# Patient Record
Sex: Female | Born: 1975 | Race: White | Hispanic: No | Marital: Married | Smoking: Never smoker
Health system: Southern US, Community
[De-identification: ages and names within clinical notes are randomized; demographics above are authoritative.]

## PROBLEM LIST (undated history)

## (undated) DIAGNOSIS — IMO0002 Reserved for concepts with insufficient information to code with codable children: Secondary | ICD-10-CM

## (undated) DIAGNOSIS — I639 Cerebral infarction, unspecified: Secondary | ICD-10-CM

## (undated) DIAGNOSIS — N8003 Adenomyosis of the uterus: Secondary | ICD-10-CM

## (undated) DIAGNOSIS — C801 Malignant (primary) neoplasm, unspecified: Secondary | ICD-10-CM

## (undated) DIAGNOSIS — E039 Hypothyroidism, unspecified: Secondary | ICD-10-CM

## (undated) DIAGNOSIS — M329 Systemic lupus erythematosus, unspecified: Secondary | ICD-10-CM

## (undated) DIAGNOSIS — N809 Endometriosis, unspecified: Secondary | ICD-10-CM

## (undated) HISTORY — DX: Adenomyosis of the uterus: N80.03

## (undated) HISTORY — PX: CHOLECYSTECTOMY: SHX55

## (undated) HISTORY — PX: ABDOMINAL HYSTERECTOMY: SHX81

## (undated) HISTORY — DX: Endometriosis, unspecified: N80.9

## (undated) HISTORY — PX: MASTECTOMY: SHX3

## (undated) HISTORY — PX: APPENDECTOMY: SHX54

## (undated) HISTORY — PX: BREAST RECONSTRUCTION: SHX9

---
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
## 1997-11-16 HISTORY — PX: ABDOMINAL HYSTERECTOMY: SHX81

## 2018-07-30 HISTORY — PX: MASTECTOMY: SHX3

## 2018-09-18 HISTORY — PX: LAPAROSCOPIC SALPINGOOPHERECTOMY: SUR795

## 2019-11-17 HISTORY — PX: OTHER SURGICAL HISTORY: SHX169

## 2021-02-16 HISTORY — PX: BREAST RECONSTRUCTION: SHX9

## 2021-12-16 ENCOUNTER — Emergency Department (HOSPITAL_BASED_OUTPATIENT_CLINIC_OR_DEPARTMENT_OTHER)
Admission: EM | Admit: 2021-12-16 | Discharge: 2021-12-16 | Disposition: A | Discharge status: Home / Self Care | Attending: Emergency Medicine | Admitting: Emergency Medicine

## 2021-12-16 ENCOUNTER — Encounter (HOSPITAL_BASED_OUTPATIENT_CLINIC_OR_DEPARTMENT_OTHER): Payer: Self-pay | Admitting: Emergency Medicine

## 2021-12-16 ENCOUNTER — Emergency Department (HOSPITAL_BASED_OUTPATIENT_CLINIC_OR_DEPARTMENT_OTHER)

## 2021-12-16 ENCOUNTER — Other Ambulatory Visit: Payer: Self-pay

## 2021-12-16 DIAGNOSIS — M545 Low back pain, unspecified: Secondary | ICD-10-CM | POA: Insufficient documentation

## 2021-12-16 DIAGNOSIS — L02211 Cutaneous abscess of abdominal wall: Secondary | ICD-10-CM | POA: Diagnosis not present

## 2021-12-16 HISTORY — DX: Systemic lupus erythematosus, unspecified: M32.9

## 2021-12-16 HISTORY — DX: Malignant (primary) neoplasm, unspecified: C80.1

## 2021-12-16 HISTORY — DX: Reserved for concepts with insufficient information to code with codable children: IMO0002

## 2021-12-16 LAB — COMPREHENSIVE METABOLIC PANEL
ALT: 33 U/L (ref 0–44)
AST: 35 U/L (ref 15–41)
Albumin: 4.1 g/dL (ref 3.5–5.0)
Alkaline Phosphatase: 109 U/L (ref 38–126)
Anion gap: 8 (ref 5–15)
BUN: 9 mg/dL (ref 6–20)
CO2: 29 mmol/L (ref 22–32)
Calcium: 9.1 mg/dL (ref 8.9–10.3)
Chloride: 101 mmol/L (ref 98–111)
Creatinine, Ser: 0.73 mg/dL (ref 0.44–1.00)
GFR, Estimated: >60 mL/min (ref >60)
Glucose, Bld: 92 mg/dL (ref 70–99)
Potassium: 4.2 mmol/L (ref 3.5–5.1)
Sodium: 138 mmol/L (ref 135–145)
Total Bilirubin: 0.4 mg/dL (ref 0.3–1.2)
Total Protein: 6.9 g/dL (ref 6.5–8.1)

## 2021-12-16 LAB — CBC WITH DIFFERENTIAL/PLATELET
Abs Immature Granulocytes: 0.02 10*3/uL (ref 0.00–0.07)
Basophils Absolute: 0.1 10*3/uL (ref 0.0–0.1)
Basophils Relative: 1 %
Eosinophils Absolute: 0.3 10*3/uL (ref 0.0–0.5)
Eosinophils Relative: 4 %
HCT: 38.1 % (ref 36.0–46.0)
Hemoglobin: 12.6 g/dL (ref 12.0–15.0)
Immature Granulocytes: 0 %
Lymphocytes Relative: 14 %
Lymphs Abs: 0.9 10*3/uL (ref 0.7–4.0)
MCH: 32 pg (ref 26.0–34.0)
MCHC: 33.1 g/dL (ref 30.0–36.0)
MCV: 96.7 fL (ref 80.0–100.0)
Monocytes Absolute: 0.5 10*3/uL (ref 0.1–1.0)
Monocytes Relative: 8 %
Neutro Abs: 4.5 10*3/uL (ref 1.7–7.7)
Neutrophils Relative %: 73 %
Platelets: 239 10*3/uL (ref 150–400)
RBC: 3.94 MIL/uL (ref 3.87–5.11)
RDW: 13.1 % (ref 11.5–15.5)
WBC: 6.3 10*3/uL (ref 4.0–10.5)
nRBC: 0 % (ref 0.0–0.2)

## 2021-12-16 MED ORDER — MORPHINE SULFATE (PF) 4 MG/ML IV SOLN
4.0000 mg | Freq: Once | INTRAVENOUS | Status: AC
  Administered 2021-12-16: 4 mg via INTRAVENOUS
  Filled 2021-12-16: qty 1

## 2021-12-16 MED ORDER — ONDANSETRON HCL 4 MG/2ML IJ SOLN
4.0000 mg | Freq: Once | INTRAMUSCULAR | Status: AC
Start: 2021-12-16 — End: 2021-12-16
  Administered 2021-12-16: 4 mg via INTRAVENOUS
  Filled 2021-12-16: qty 2

## 2021-12-16 MED ORDER — IOHEXOL 300 MG/ML  SOLN
100.0000 mL | Freq: Once | INTRAMUSCULAR | Status: AC | PRN
  Administered 2021-12-16: 100 mL via INTRAVENOUS

## 2021-12-16 NOTE — ED Triage Notes (Signed)
Back pain x 1 month  had hx of lupus and hsx of breast  cancer ,  pain  lower mid back  just moved here and has not gotten a dr yet ?

## 2021-12-16 NOTE — ED Notes (Signed)
Pt back from CT

## 2021-12-16 NOTE — Discharge Instructions (Signed)
Call your primary care doctor or specialist as discussed in the next 2-3 days.   Return immediately back to the ER if:  Your symptoms worsen within the next 12-24 hours. You develop new symptoms such as new fevers, persistent vomiting, new pain, shortness of breath, or new weakness or numbness, or if you have any other concerns.  

## 2021-12-16 NOTE — ED Provider Notes (Signed)
?[facility] DEPT ?Provider Note ? ? ?CSN: [id number] ?Arrival date & time: 12/16/21  1351 ? ?  ? ?History ? ?Chief Complaint  ?Patient presents with  ? Back Pain  ? ? ?Taylor Chandler is a 46 y.o. female. ? ?Patient presents with chief complaint of lower back pain.  Symptoms ongoing for a month.  No instigating cause that the patient can recall.  Pain felt worse last night so she presents to the ER.  Denies any new numbness or weakness.  Denies any bowel or bladder function or fevers or unintentional weight loss.  She has a history of stage III cancer in remission for the past 2 years.  She is recently moved to this area and strain to reestablish primary and oncology care.  She otherwise denies any fevers or cough or vomiting or diarrhea.  She also had abdominal abscess about a week ago that has since resolved. ? ? ?  ? ?Home Medications ?Prior to Admission medications   ?Not on File  ?   ? ?Allergies    ?Patient has no known allergies.   ? ?Review of Systems   ?Review of Systems  ?Constitutional:  Negative for fever.  ?HENT:  Negative for ear pain.   ?Eyes:  Negative for pain.  ?Respiratory:  Negative for cough.   ?Cardiovascular:  Negative for chest pain.  ?Gastrointestinal:  Negative for abdominal pain.  ?Genitourinary:  Negative for flank pain.  ?Musculoskeletal:  Positive for back pain.  ?Skin:  Negative for rash.  ?Neurological:  Negative for headaches.  ? ?Physical Exam ?Updated Vital Signs ?BP 132/72 (BP Location: Right Arm)   Pulse 74   Temp 98.4 ?F (36.9 ?C)   Resp 18   Ht '5\' 6"'$  (1.676 m)   Wt 72.6 kg   SpO2 100%   BMI 25.82 kg/m?  ?Physical Exam ?Constitutional:   ?   General: She is not in acute distress. ?   Appearance: Normal appearance.  ?HENT:  ?   Head: Normocephalic.  ?   Nose: Nose normal.  ?Eyes:  ?   Extraocular Movements: Extraocular movements intact.  ?Cardiovascular:  ?   Rate and Rhythm: Normal rate.  ?Pulmonary:  ?   Effort: Pulmonary effort is normal.   ?Musculoskeletal:     ?   General: Normal range of motion.  ?   Cervical back: Normal range of motion.  ?   Comments: L2-3 midline tenderness noted.  No step-off noted.  No T or C-spine step-offs or tenderness noted. ? ?Strength is otherwise 5/5 strength all extremities gait is mildly antalgic but stable without assistance.  ?Neurological:  ?   General: No focal deficit present.  ?   Mental Status: She is alert. Mental status is at baseline.  ? ? ?ED Results / Procedures / Treatments   ?Labs ?(all labs ordered are listed, but only abnormal results are displayed) ?Labs Reviewed  ?CBC WITH DIFFERENTIAL/PLATELET  ?COMPREHENSIVE METABOLIC PANEL  ? ? ?EKG ?None ? ?Radiology ?CT Abdomen Pelvis W Contrast ? ?Result Date: 12/16/2021 ?CLINICAL DATA:  Back pain. History of cancer in remission. History of lupus. History of breast cancer. Mid lower back pain. History of hysterectomy. EXAM: CT ABDOMEN AND PELVIS WITH CONTRAST TECHNIQUE: Multidetector CT imaging of the abdomen and pelvis was performed using the standard protocol following bolus administration of intravenous contrast. RADIATION DOSE REDUCTION: This exam was performed according to the departmental dose-optimization program which includes automated exposure control, adjustment of the mA and/or kV according to  patient size and/or use of iterative reconstruction technique. CONTRAST:  111m OMNIPAQUE IOHEXOL 300 MG/ML  SOLN COMPARISON:  None available FINDINGS: Lower chest: Lung bases are unremarkable. Hepatobiliary: Smooth liver contours. Low-attenuation likely focal fatty deposition adjacent to the falciform ligament benign. Status post cholecystectomy. The common bile duct measures up to approximately 8 mm. Minimal central intrahepatic biliary ductal dilatation. These findings are likely secondary to reservoir effect from the prior cholecystectomy. Pancreas: No mass or inflammatory fat stranding. No pancreatic ductal dilatation is seen. Spleen: Normal in size  without focal abnormality. Tiny splenule seen inferiorly. Adrenals/Urinary Tract: Adrenal glands are unremarkable. The kidneys enhance uniformly and are symmetric in size without hydronephrosis. No renal stone is seen. No renal mass is seen. No focal urinary bladder wall thickening. Stomach/Bowel: There air-fluid levels throughout the mid to distal aspect of the colon. The proximal transverse colon and distal ascending colon are decompressed. There is also fluid within the ascending colon. The descending aspect of the ascending colon appears to extend medially and then superiorly towards the left upper quadrant (axial series 2 images 33 through 49, coronal series 5, images 53-59). There appears to be narrowing of the lumen and thickening of this bowel wall which may represent the proximal ascending colon, there is fecal material seen within a more distended loop of what may represent the cecum in the left upper quadrant (coronal image 53, axial image 37). There does appear to be fat around the insertion of the terminal ileum in this location (axial image 34 and coronal image 61). The appendix is not confidently identified. Vascular/Lymphatic: No abdominal aortic aneurysm. No mesenteric, retroperitoneal, or pelvic lymphadenopathy. Reproductive: The uterus is surgically absent. No gross adnexal abnormality. Other: Surgical clips overlie the anterior inferior peritoneal wall including the anterior pelvis.No abdominopelvic ascites. No pneumoperitoneum. Musculoskeletal: No acute or significant osseous findings. IMPRESSION:: IMPRESSION: There air-fluid levels within the majority of the colon compatible with diarrheal illness. There appears to be extension of the proximal ascending colon and cecum into the left upper abdominal quadrant. In the region of the bowel favored to represent the proximal ascending colon extending to this left upper quadrant region, there is luminal narrowing and wall thickening compatible with  colitis. Recommend correlation with colonoscopy history, and consider repeat colonoscopy after the patient's symptoms reside to ensure no underlying colon wall mass. Electronically Signed   By: RYvonne KendallM.D.   On: 12/16/2021 17:14   ? ?Procedures ?Procedures  ? ? ?Medications Ordered in ED ?Medications  ?morphine (PF) 4 MG/ML injection 4 mg (4 mg Intravenous Given 12/16/21 1556)  ?ondansetron ([facility] injection 4 mg (4 mg Intravenous Given 12/16/21 1555)  ?iohexol (OMNIPAQUE) 300 MG/ML solution 100 mL (100 mLs Intravenous Contrast Given 12/16/21 1650)  ? ? ?ED Course/ Medical Decision Making/ A&P ?  ?                        ?Medical Decision Making ?Amount and/or Complexity of Data Reviewed ?Labs: ordered. ?Radiology: ordered. ? ?Risk ?Prescription drug management. ? ? ?Chart review shows no recent visits. ? ?History obtained from the patient and her caregiver at bedside. ? ?Cardiac monitoring showing sinus rhythm. ? ?Labs are sent chemistry normal white count normal.  CT abdomen pelvis shows evidence of colitis but no cord lesion or mass noted. ? ?Patient symptoms improved with medications provided in the ER, advising outpatient follow-up with her doctor within the week and with oncology this month.  Advising immediate return for  worsening symptoms or any additional concerns. ? ? ? ? ? ? ? ?Final Clinical Impression(s) / ED Diagnoses ?Final diagnoses:  ?Bilateral low back pain without sciatica, unspecified chronicity  ? ? ?Rx / DC Orders ?ED Discharge Orders   ? ? None  ? ?  ? ? ?  ?Luna Fuse, MD ?12/16/21 1740 ? ?

## 2022-02-08 ENCOUNTER — Encounter: Payer: Self-pay | Admitting: Gastroenterology

## 2022-02-08 ENCOUNTER — Ambulatory Visit: Admitting: Gastroenterology

## 2022-02-08 VITALS — BP 106/80 | HR 114 | Ht 66.0 in | Wt 161.0 lb

## 2022-02-08 DIAGNOSIS — R110 Nausea: Secondary | ICD-10-CM

## 2022-02-08 DIAGNOSIS — K5909 Other constipation: Secondary | ICD-10-CM

## 2022-02-08 DIAGNOSIS — K529 Noninfective gastroenteritis and colitis, unspecified: Secondary | ICD-10-CM | POA: Diagnosis not present

## 2022-02-08 DIAGNOSIS — M329 Systemic lupus erythematosus, unspecified: Secondary | ICD-10-CM

## 2022-02-08 DIAGNOSIS — Z8379 Family history of other diseases of the digestive system: Secondary | ICD-10-CM

## 2022-02-08 DIAGNOSIS — Z800 Family history of malignant neoplasm of digestive organs: Secondary | ICD-10-CM

## 2022-02-08 DIAGNOSIS — R112 Nausea with vomiting, unspecified: Secondary | ICD-10-CM

## 2022-02-08 MED ORDER — PLENVU 140 G PO SOLR: 1.0000 | ORAL | 0 refills | Status: DC

## 2022-02-08 NOTE — Patient Instructions (Addendum)
Toileting tips to help with your constipation - Drink at least 64-80 ounces of water/liquid per day. - Establish a time to try to move your bowels every day.  For many people, this is after a cup of coffee or after a meal such as breakfast. - Sit all of the way back on the toilet keeping your back fairly straight and while sitting up, try to rest the tops of your forearms on your upper thighs.   - Raising your feet with a step stool/squatty potty can be helpful to improve the angle that allows your stool to pass through the rectum. - Relax the rectum feeling it bulge toward the toilet water.  If you feel your rectum raising toward your body, you are contracting rather than relaxing. - Breathe in and slowly exhale. "Belly breath" by expanding your belly towards your belly button. Keep belly expanded as you gently direct pressure down and back to the anus.  A low pitched GRRR sound can assist with increasing intra-abdominal pressure.  - Repeat 3-4 times. If unsuccessful, contract the pelvic floor to restore normal tone and get off the toilet.  Avoid excessive straining. - To reduce excessive wiping by teaching your anus to normally contract, place hands on outer aspect of knees and resist knee movement outward.  Hold 5-10 second then place hands just inside of knees and resist inward movement of knees.  Hold 5 seconds.  Repeat a few times each way.  Your provider has requested that you go to the basement level for lab work before leaving today. Press "B" on the elevator. The lab is located at the first door on the left as you exit the elevator.   We have sent the following medications to your pharmacy for you to pick up at your convenience: Plenvu  Start Linzess 1107mg - take 1 capsule by mouth once daily.   You have been referred to Primary Care and Rheumatology. Their office will reach out to you to schedule. If you have not heard from them in 1-2 weeks, please call our office and let uKoreaknow.    Thank you for choosing [facility] and [facility].  Dr. MRush Landmark

## 2022-02-08 NOTE — Progress Notes (Signed)
GASTROENTEROLOGY OUTPATIENT CLINIC VISIT   Primary Care Provider Pcp, No No address on file None  Referring Provider No referring provider defined for this encounter.   Patient Profile: Taylor Chandler is a 46 y.o. female with a pmh significant for previous breast cancer, lupus, anxiety, prior mast cell enteritis, family history of early colon cancer (father), family history of celiac (daughter), status postcholecystectomy, status post appendectomy, chronic constipation.  The patient presents to the [facility] for an evaluation and management of problem(s) noted below:  Problem List 1. Chronic constipation   2. Mast cell enteritis   3. Nausea without vomiting   4. Family history of colon cancer   5. Family history of celiac disease   6. Lupus ([place])     History of Present Illness This is the patient's first visit to the outpatient [facility].  The patient is a self-referral.  She previously lived in [place] in [place].  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  She went on 6 months of histamine blockers and had improvement in her symptoms.  Patient eventually moved to [facility] where her diagnosis of lupus was qualified.  She has been on treatment for her lupus since.  She has symptoms of nausea on a irregular basis.  She is not vomiting but has not done well with Zofran or Phenergan in the past.  Carafate when she has had issues has been helpful and she is wondering if she can have any of that.  Her symptoms of abdominal pain and nausea and vomiting have not gotten to the point where they were back when she had last seen gastroenterology but she wants to be as thoughtful and thorough as possible.  She deals with constipation on a regular basis and uses the bathroom 2-3 times per week.  She has used MiraLAX in the past but has felt more issues.  When she increases her fiber she gets  more bloating as well.  The patient has not used Linzess or Trulance or Amitiza in the past.  She has a family history of early colon cancer in her father and she would like to proceed with colonoscopy for screening purposes.  Her daughter has a history of celiac sprue and follows a gluten-free diet thoroughly.  She has never had a colonoscopy.  She does not take significant nonsteroidals or BC/Goody powders.  She needs a referral to rheumatology as well as to primary care.  GI Review of Systems Positive as above Negative for dysphagia, odynophagia, pain, alteration of bowel habits, melena, hematochezia  Review of Systems General: Denies fevers/chills/weight loss unintentionally HEENT: Denies oral lesions Cardiovascular: Denies chest pain Pulmonary: Denies shortness of breath Gastroenterological: See HPI Genitourinary: Denies darkened urine  Hematological: Denies easy bruising/bleeding Endocrine: Denies temperature intolerance Dermatological: Denies jaundice Psychological: Mood is stable   Medications Current Outpatient Medications  Medication Sig Dispense Refill   ALPRAZolam (XANAX) 1 MG tablet Take 1 mg by mouth 2 (two) times daily as needed.     Ascorbic Acid (VITAMIN C PO) Take 1 tablet by mouth daily in the afternoon.     buPROPion (WELLBUTRIN XL) 300 MG 24 hr tablet Take 300 mg by mouth daily.     Calcium Carb-Cholecalciferol (CALCIUM 500+D3 PO) Take 1 tablet by mouth daily in the afternoon.     Cyanocobalamin (VITAMIN B-12 PO) Take by mouth.     DULoxetine (  CYMBALTA) 60 MG capsule Take 60 mg by mouth daily.     hydroxychloroquine (PLAQUENIL) 200 MG tablet Take 2 tablets by mouth daily as needed. Take Monday through Saturday     letrozole (FEMARA) 2.5 MG tablet Take 2.5 mg by mouth daily.     MAGNESIUM PO Take 1 tablet by mouth daily in the afternoon.     PEG-KCl-NaCl-NaSulf-Na Asc-C (PLENVU) 140 g SOLR Take 1 kit by mouth as directed. Use coupon: BIN: [id number] PNC: CNRX Group:  [id number] ID: [id number] 1 each 0   sucralfate (CARAFATE) 1 g tablet Take 1 tablet (1 g total) by mouth 2 (two) times daily. 60 tablet 6   No current facility-administered medications for this visit.    Allergies No Known Allergies  Histories Past Medical History:  Diagnosis Date   Cancer ([place])    Breast Cancer. In remission 09/2019 Advance Stage 3 Last infusion Reclast done in August 2022   Lupus [facility])    Past Surgical History:  Procedure Laterality Date   ABDOMINAL HYSTERECTOMY     APPENDECTOMY     BREAST RECONSTRUCTION     Dip Flap surgery as well   CHOLECYSTECTOMY     MASTECTOMY Bilateral    Dip Flap surgery as well   Social History   Socioeconomic History   Marital status: Married    Spouse name: Not on file   Number of children: Not on file   Years of education: Not on file   Highest education level: Not on file  Occupational History   Not on file  Tobacco Use   Smoking status: Never    Passive exposure: Never   Smokeless tobacco: Never  Vaping Use   Vaping Use: Never used  Substance and Sexual Activity   Alcohol use: Not Currently   Drug use: Never   Sexual activity: Not on file  Other Topics Concern   Not on file  Social History Narrative   Not on file   Social Determinants of Health   Financial Resource Strain: Not on file  Food Insecurity: Not on file  Transportation Needs: Not on file  Physical Activity: Not on file  Stress: Not on file  Social Connections: Not on file  Intimate Partner Violence: Not on file   Family History  Problem Relation Age of Onset   Breast cancer Mother    Colon cancer Father    Esophageal cancer Neg Hx    Inflammatory bowel disease Neg Hx    Liver disease Neg Hx    Pancreatic cancer Neg Hx    Stomach cancer Neg Hx    Rectal cancer Neg Hx    I have reviewed her medical, social, and family history in detail and updated the electronic medical record as necessary.    PHYSICAL EXAMINATION  BP 106/80   Pulse  (!) 114   Ht 5' 6"  (1.676 m)   Wt 161 lb (73 kg)   BMI 25.99 kg/m  Wt Readings from Last 3 Encounters:  02/08/22 161 lb (73 kg)  12/16/21 160 lb (72.6 kg)  GEN: NAD, appears stated age, doesn't appear chronically ill PSYCH: Cooperative, without pressured speech EYE: Conjunctivae pink, sclerae anicteric ENT: MMM, without oral ulcers, no erythema or exudates noted CV: RR without R/Gs  RESP: CTAB posteriorly, without wheezing GI: NABS, soft, NT/ND, without rebound or guarding, no HSM appreciated MSK/EXT: No lower extremity edema SKIN: No jaundice NEURO:  Alert & Oriented x 3, no focal deficits   REVIEW OF DATA  I reviewed the following data at the time of this encounter:  GI Procedures and Studies  Outside records based on care everywhere 2016 GI work-up/EGD EGD [facility] 2016 The esophagus biopsy was normal.. The stomach biopsy was showed mild chronic gastritis without presence of H. pylori. Increased mast cells over 20/hpf were noted in duodenum.   Laboratory Studies  Reviewed those in epic and care everywhere  Imaging Studies  2016 abdominal ultrasound outside report  Impression:        Surgically absent gallbladder. Tiny probable hemangioma in the liver     2016 CT abdomen pelvis with contrast outside report IMPRESSION-  1.  Status post cholecystectomy, appendectomy, and hysterectomy..  2.  Fluid filling the mid and upper aspect of the vagina of uncertain etiology.  3.  No acute abnormality identified in the abdomen.     ASSESSMENT  Ms. Manter is a 46 y.o. female with a pmh significant for previous breast cancer, lupus, anxiety, prior mast cell enteritis, family history of early colon cancer (father), family history of celiac (daughter), status postcholecystectomy, status post appendectomy, chronic constipation.  The patient is seen today for evaluation and management of:  1. Chronic constipation   2. Mast cell enteritis   3. Nausea without vomiting   4. Family history  of colon cancer   5. Family history of celiac disease   6. Lupus ([place])    The patient is hemodynamically stable.  Clinically her symptoms have been longstanding in regards to her nausea.  It is interesting that she did have a mast cell enteritis previously and with her family history of celiac sprue, it is reasonable to proceed with a repeat upper endoscopy to evaluate her nausea and ensure that she has not had recurrence of mast cell enteritis.  In regards to her long-term constipation we will try to work on increasing her fiber supplementation.  We will also try samples of laxative therapy that may be helpful for the patient.  She is due for colon cancer screening for high rescreening in the setting of her family history and we will proceed with doing that at the same time as her endoscopy.  I will place a referral to rheumatology for follow-up of her underlying lupus.  I will also place a referral to primary care at her request.  The risks and benefits of endoscopic evaluation were discussed with the patient; these include but are not limited to the risk of perforation, infection, bleeding, missed lesions, lack of diagnosis, severe illness requiring hospitalization, as well as anesthesia and sedation related illnesses.  The patient and/or family is agreeable to proceed.  We will give the patient Carafate therapy as she feels that was very helpful for her when she had previous significant issues of nausea that she use that on an as-needed basis.  All patient questions were answered to the best of my ability, and the patient agrees to the aforementioned plan of action with follow-up as indicated.   PLAN  Carafate 1 g twice daily as needed Laboratories as outlined below EGD with gastric and duodenal biopsies at minimum Colonoscopy for high risk screening Linzess 145 mcg samples given to patient - If helpful patient will let us know and we can send in prescription or uptitrate or down titrate medication  as needed Referral to rheumatology Referral to primary care   Orders Placed This Encounter  Procedures   CBC   Comp Met (CMET)   Calcium, ionized   Lipase   TSH  IgA   Tissue transglutaminase, IgA   Ambulatory referral to Gastroenterology   Ambulatory referral to [facility]   Ambulatory referral to Rheumatology   Ambulatory referral to Gastroenterology    New Prescriptions   PEG-KCL-NACL-NASULF-NA ASC-C (PLENVU) 140 G SOLR    Take 1 kit by mouth as directed. Use coupon: BIN: [id number] PNC: CNRX Group: [id number] ID: [id number]   SUCRALFATE (CARAFATE) 1 G TABLET    Take 1 tablet (1 g total) by mouth 2 (two) times daily.   Modified Medications   No medications on file    Planned Follow Up No follow-ups on file.   Total Time in Face-to-Face and in Coordination of Care for patient including independent/personal interpretation/review of prior testing, medical history, examination, medication adjustment, communicating results with the patient directly, and documentation within the EHR is 45 minutes.   Justice Britain, MD [facility] Office # [phone]

## 2022-02-09 ENCOUNTER — Encounter: Payer: Self-pay | Admitting: Gastroenterology

## 2022-02-10 ENCOUNTER — Telehealth: Payer: Self-pay

## 2022-02-10 MED ORDER — SUCRALFATE 1 G PO TABS: 1.0000 g | ORAL_TABLET | Freq: Two times a day (BID) | ORAL | 6 refills | Status: DC

## 2022-02-10 NOTE — Telephone Encounter (Signed)
Mansouraty, Telford Nab., MD  Carole Binning, LPN; [place], Amy Merian Capron, Marthenia Rolling, RN No problem.  I will reach out to the patient so that she can get all of the records from her extensive evaluation in [place] and [place].  We can have her bring those to [place] and get them scanned so that you all can review.  Thanks.   Doyce Saling,  Please reach out to patient and let her know that rheumatology needs to see her prior records before they can schedule a clinic visit.  She can work on trying to bring a copy of those or get [place] the information so that we can send a release of information.  Thanks.  GM   GM        Previous Messages   ----- Message -----  From: Carole Binning, LPN  Sent: 03/15/3150   2:40 PM EDT  To: Darden Dates, Irving Copas., MD   Due to patient's history of Lupus, please have your office arrange with patient to obtain and send previous rheumatology records for review. We will be unable to complete the review process until the records have been received. Thanks!

## 2022-02-10 NOTE — Telephone Encounter (Signed)
We did discuss that yesterday. My apologies. Carafate 1 tablet twice daily as needed will be great.  60 Tablets (6 refills). Thanks. GM

## 2022-02-10 NOTE — Telephone Encounter (Signed)
I spoke with the pt and she will bring the records by next week.  She also is asking if she can have a prescription for carafate.  Please advise

## 2022-02-10 NOTE — Telephone Encounter (Signed)
The pt has been advised that the prescription has been sent for Carafate.

## 2022-02-12 ENCOUNTER — Encounter: Payer: Self-pay | Admitting: Gastroenterology

## 2022-02-12 DIAGNOSIS — M329 Systemic lupus erythematosus, unspecified: Secondary | ICD-10-CM | POA: Insufficient documentation

## 2022-02-12 DIAGNOSIS — Z8379 Family history of other diseases of the digestive system: Secondary | ICD-10-CM | POA: Insufficient documentation

## 2022-02-12 DIAGNOSIS — Z800 Family history of malignant neoplasm of digestive organs: Secondary | ICD-10-CM | POA: Insufficient documentation

## 2022-02-12 DIAGNOSIS — R110 Nausea: Secondary | ICD-10-CM | POA: Insufficient documentation

## 2022-02-12 DIAGNOSIS — K529 Noninfective gastroenteritis and colitis, unspecified: Secondary | ICD-10-CM | POA: Insufficient documentation

## 2022-02-12 DIAGNOSIS — K5909 Other constipation: Secondary | ICD-10-CM | POA: Insufficient documentation

## 2022-02-21 ENCOUNTER — Other Ambulatory Visit (INDEPENDENT_AMBULATORY_CARE_PROVIDER_SITE_OTHER)

## 2022-02-21 DIAGNOSIS — Z800 Family history of malignant neoplasm of digestive organs: Secondary | ICD-10-CM

## 2022-02-21 DIAGNOSIS — K529 Noninfective gastroenteritis and colitis, unspecified: Secondary | ICD-10-CM | POA: Diagnosis not present

## 2022-02-21 DIAGNOSIS — M329 Systemic lupus erythematosus, unspecified: Secondary | ICD-10-CM | POA: Diagnosis not present

## 2022-02-21 DIAGNOSIS — Z8379 Family history of other diseases of the digestive system: Secondary | ICD-10-CM

## 2022-02-21 DIAGNOSIS — K5909 Other constipation: Secondary | ICD-10-CM

## 2022-02-21 DIAGNOSIS — R110 Nausea: Secondary | ICD-10-CM

## 2022-02-21 LAB — COMPREHENSIVE METABOLIC PANEL
ALT: 35 U/L (ref 0–35)
AST: 62 U/L — ABNORMAL HIGH (ref 0–37)
Albumin: 4 g/dL (ref 3.5–5.2)
Alkaline Phosphatase: 185 U/L — ABNORMAL HIGH (ref 39–117)
BUN: 4 mg/dL — ABNORMAL LOW (ref 6–23)
CO2: 27 mEq/L (ref 19–32)
Calcium: 9.4 mg/dL (ref 8.4–10.5)
Chloride: 101 mEq/L (ref 96–112)
Creatinine, Ser: 0.68 mg/dL (ref 0.40–1.20)
GFR: 103.37 mL/min (ref >60.00)
Glucose, Bld: 93 mg/dL (ref 70–99)
Potassium: 3.9 mEq/L (ref 3.5–5.1)
Sodium: 138 mEq/L (ref 135–145)
Total Bilirubin: 0.6 mg/dL (ref 0.2–1.2)
Total Protein: 6.9 g/dL (ref 6.0–8.3)

## 2022-02-21 LAB — CBC
HCT: 38.5 % (ref 36.0–46.0)
Hemoglobin: 12.9 g/dL (ref 12.0–15.0)
MCHC: 33.4 g/dL (ref 30.0–36.0)
MCV: 96.8 fl (ref 78.0–100.0)
Platelets: 267 10*3/uL (ref 150.0–400.0)
RBC: 3.97 Mil/uL (ref 3.87–5.11)
RDW: 14.3 % (ref 11.5–15.5)
WBC: 3.1 10*3/uL — ABNORMAL LOW (ref 4.0–10.5)

## 2022-02-21 LAB — TSH: TSH: 4.62 u[IU]/mL (ref 0.35–5.50)

## 2022-02-21 LAB — LIPASE: Lipase: 20 U/L (ref 11.0–59.0)

## 2022-02-22 ENCOUNTER — Other Ambulatory Visit: Payer: Self-pay

## 2022-02-22 DIAGNOSIS — R7989 Other specified abnormal findings of blood chemistry: Secondary | ICD-10-CM

## 2022-02-23 LAB — IGA: Immunoglobulin A: 219 mg/dL (ref 47–310)

## 2022-02-23 LAB — TISSUE TRANSGLUTAMINASE, IGA: (tTG) Ab, IgA: <1 U/mL

## 2022-02-23 LAB — CALCIUM, IONIZED: Calcium, Ion: 5 mg/dL (ref 4.7–5.5)

## 2022-03-01 ENCOUNTER — Encounter: Payer: Self-pay | Admitting: Gastroenterology

## 2022-03-01 ENCOUNTER — Other Ambulatory Visit (INDEPENDENT_AMBULATORY_CARE_PROVIDER_SITE_OTHER)

## 2022-03-01 ENCOUNTER — Ambulatory Visit (AMBULATORY_SURGERY_CENTER): Admitting: Gastroenterology

## 2022-03-01 VITALS — BP 117/73 | HR 70 | Temp 98.0°F | Resp 15 | Ht 66.0 in | Wt 161.0 lb

## 2022-03-01 DIAGNOSIS — R7989 Other specified abnormal findings of blood chemistry: Secondary | ICD-10-CM

## 2022-03-01 DIAGNOSIS — K2970 Gastritis, unspecified, without bleeding: Secondary | ICD-10-CM

## 2022-03-01 DIAGNOSIS — K2950 Unspecified chronic gastritis without bleeding: Secondary | ICD-10-CM | POA: Diagnosis not present

## 2022-03-01 DIAGNOSIS — K259 Gastric ulcer, unspecified as acute or chronic, without hemorrhage or perforation: Secondary | ICD-10-CM

## 2022-03-01 DIAGNOSIS — K31A0 Gastric intestinal metaplasia, unspecified: Secondary | ICD-10-CM

## 2022-03-01 DIAGNOSIS — Z800 Family history of malignant neoplasm of digestive organs: Secondary | ICD-10-CM | POA: Diagnosis not present

## 2022-03-01 DIAGNOSIS — D128 Benign neoplasm of rectum: Secondary | ICD-10-CM

## 2022-03-01 DIAGNOSIS — R110 Nausea: Secondary | ICD-10-CM | POA: Diagnosis not present

## 2022-03-01 DIAGNOSIS — D127 Benign neoplasm of rectosigmoid junction: Secondary | ICD-10-CM

## 2022-03-01 DIAGNOSIS — K5909 Other constipation: Secondary | ICD-10-CM

## 2022-03-01 DIAGNOSIS — Z1211 Encounter for screening for malignant neoplasm of colon: Secondary | ICD-10-CM | POA: Diagnosis present

## 2022-03-01 DIAGNOSIS — K2980 Duodenitis without bleeding: Secondary | ICD-10-CM | POA: Diagnosis not present

## 2022-03-01 LAB — HEPATIC FUNCTION PANEL
ALT: 24 U/L (ref 0–35)
AST: 22 U/L (ref 0–37)
Albumin: 4.4 g/dL (ref 3.5–5.2)
Alkaline Phosphatase: 155 U/L — ABNORMAL HIGH (ref 39–117)
Bilirubin, Direct: 0.1 mg/dL (ref 0.0–0.3)
Total Bilirubin: 0.4 mg/dL (ref 0.2–1.2)
Total Protein: 7.4 g/dL (ref 6.0–8.3)

## 2022-03-01 MED ORDER — SODIUM CHLORIDE 0.9 % IV SOLN: 500.0000 mL | Freq: Once | INTRAVENOUS | Status: DC

## 2022-03-01 MED ORDER — ESOMEPRAZOLE MAGNESIUM 40 MG PO CPDR: 40.0000 mg | DELAYED_RELEASE_CAPSULE | Freq: Two times a day (BID) | ORAL | 6 refills | Status: DC

## 2022-03-01 NOTE — Progress Notes (Signed)
A/ox3, pleased with MAC, report to RN 

## 2022-03-01 NOTE — Patient Instructions (Signed)
Await pathology results.  Repeat EGD in 4 months.  A prescription for Nexium '40mg'$  , twice daily was sent to your pharmacy.   YOU HAD AN ENDOSCOPIC PROCEDURE TODAY AT [facility]:   Refer to the procedure report that was given to you for any specific questions about what was found during the examination.  If the procedure report does not answer your questions, please call your gastroenterologist to clarify.  If you requested that your care partner not be given the details of your procedure findings, then the procedure report has been included in a sealed envelope for you to review at your convenience later.  YOU SHOULD EXPECT: Some feelings of bloating in the abdomen. Passage of more gas than usual.  Walking can help get rid of the air that was put into your GI tract during the procedure and reduce the bloating. If you had a lower endoscopy (such as a colonoscopy or flexible sigmoidoscopy) you may notice spotting of blood in your stool or on the toilet paper. If you underwent a bowel prep for your procedure, you may not have a normal bowel movement for a few days.  Please Note:  You might notice some irritation and congestion in your nose or some drainage.  This is from the oxygen used during your procedure.  There is no need for concern and it should clear up in a day or so.  SYMPTOMS TO REPORT IMMEDIATELY:  Following lower endoscopy (colonoscopy or flexible sigmoidoscopy):  Excessive amounts of blood in the stool  Significant tenderness or worsening of abdominal pains  Swelling of the abdomen that is new, acute  Fever of 100F or higher  Following upper endoscopy (EGD)  Vomiting of blood or coffee ground material  New chest pain or pain under the shoulder blades  Painful or persistently difficult swallowing  New shortness of breath  Fever of 100F or higher  Black, tarry-looking stools  For urgent or emergent issues, a gastroenterologist can be reached at any hour by  calling [phone]. Do not use MyChart messaging for urgent concerns.    DIET:  We do recommend a small meal at first, but then you may proceed to your regular diet.  Drink plenty of fluids but you should avoid alcoholic beverages for 24 hours.  ACTIVITY:  You should plan to take it easy for the rest of today and you should NOT DRIVE or use heavy machinery until tomorrow (because of the sedation medicines used during the test).    FOLLOW UP: Our staff will call the number listed on your records 24-72 hours following your procedure to check on you and address any questions or concerns that you may have regarding the information given to you following your procedure. If we do not reach you, we will leave a message.  We will attempt to reach you two times.  During this call, we will ask if you have developed any symptoms of COVID 19. If you develop any symptoms (ie: fever, flu-like symptoms, shortness of breath, cough etc.) before then, please call [phone].  If you test positive for Covid 19 in the 2 weeks post procedure, please call and report this information to [place].    If any biopsies were taken you will be contacted by phone or by letter within the next 1-3 weeks.  Please call us at [phone] if you have not heard about the biopsies in 3 weeks.    SIGNATURES/CONFIDENTIALITY: You and/or your care partner have signed  paperwork which will be entered into your electronic medical record.  These signatures attest to the fact that that the information above on your After Visit Summary has been reviewed and is understood.  Full responsibility of the confidentiality of this discharge information lies with you and/or your care-partner.

## 2022-03-01 NOTE — Op Note (Signed)
[facility] Patient Name: Taylor Chandler Procedure Date: 03/01/2022 2:15 PM MRN: [id number] Endoscopist: Justice Britain , MD Age: 46 Referring MD:  Date of Birth: 07-07-1976 Gender: Female Account #: [id number] Procedure:                Upper GI endoscopy Indications:              Nausea, History of Mast Cell enteritis, Family                            history of Celiac disease Medicines:                Monitored Anesthesia Care Procedure:                Pre-Anesthesia Assessment:                           - Prior to the procedure, a History and Physical                            was performed, and patient medications and                            allergies were reviewed. The patient's tolerance of                            previous anesthesia was also reviewed. The risks                            and benefits of the procedure and the sedation                            options and risks were discussed with the patient.                            All questions were answered, and informed consent                            was obtained. Prior Anticoagulants: The patient has                            taken no previous anticoagulant or antiplatelet                            agents. ASA Grade Assessment: II - A patient with                            mild systemic disease. After reviewing the risks                            and benefits, the patient was deemed in                            satisfactory condition to undergo the procedure.  After obtaining informed consent, the endoscope was                            passed under direct vision. Throughout the                            procedure, the patient's blood pressure, pulse, and                            oxygen saturations were monitored continuously. The                            GIF [id number] #[id number] was introduced through the                            mouth, and advanced to the  second part of duodenum.                            The upper GI endoscopy was accomplished without                            difficulty. The patient tolerated the procedure. Scope In: Scope Out: Findings:                 No gross lesions were noted in the entire esophagus.                           The Z-line was regular and was found 38 cm from the                            incisors.                           One non-bleeding linear gastric ulcer with a clean                            ulcer base (Forrest Class III) was found at the                            pylorus. The lesion was 8 mm in largest dimension.                           Localized moderately erythematous mucosa without                            bleeding was found in the gastric antrum and in the                            prepyloric region of the stomach.                           No other gross lesions were noted in the entire  examined stomach. Biopsies were taken with a cold                            forceps for histology and Helicobacter pylori                            testing.                           Mucosal flattening was found in the duodenal bulb,                            in the first portion of the duodenum and in the                            second portion of the duodenum. Biopsies were taken                            with a cold forceps for histology. Complications:            No immediate complications. Estimated Blood Loss:     Estimated blood loss was minimal. Impression:               - No gross lesions in esophagus. Z-line regular, 38                            cm from the incisors.                           - Non-bleeding gastric ulcer with a clean ulcer                            base (Forrest Class III) in pylorus.                           - Erythematous mucosa in the antrum and prepyloric                            region of the stomach. No other gross lesions in                             the stomach. Biopsied.                           - Flattened mucosa was found in the duodenum.                            Biopsied. Recommendation:           - Proceed to scheduled colonoscopy.                           - Recommend initiation of PPI 40 mg twice daily for                            next  few months.                           - Continue present medications otherwise.                           - Await pathology results.                           - Repeat EGD in 87-month to ensure healing of                            gastric ulcer and PPI downtitration.                           - The findings and recommendations were discussed                            with the patient.                           - The findings and recommendations were discussed                            with the patient's family. GJustice Britain MD 03/01/2022 2:58:44 PM

## 2022-03-01 NOTE — Progress Notes (Signed)
Called to room to assist during endoscopic procedure.  Patient ID and intended procedure confirmed with present staff. Received instructions for my participation in the procedure from the performing physician.  

## 2022-03-01 NOTE — Op Note (Signed)
[facility] Patient Name: Taylor Chandler Procedure Date: 03/01/2022 2:14 PM MRN: [id number] Endoscopist: Justice Britain , MD Age: 46 Referring MD:  Date of Birth: June 18, 1976 Gender: Female Account #: [id number] Procedure:                Colonoscopy Indications:              Screening in patient at increased risk: Colorectal                            cancer in father Medicines:                Monitored Anesthesia Care Procedure:                Pre-Anesthesia Assessment:                           - Prior to the procedure, a History and Physical                            was performed, and patient medications and                            allergies were reviewed. The patient's tolerance of                            previous anesthesia was also reviewed. The risks                            and benefits of the procedure and the sedation                            options and risks were discussed with the patient.                            All questions were answered, and informed consent                            was obtained. Prior Anticoagulants: The patient has                            taken no previous anticoagulant or antiplatelet                            agents. ASA Grade Assessment: II - A patient with                            mild systemic disease. After reviewing the risks                            and benefits, the patient was deemed in                            satisfactory condition to undergo the procedure.  After obtaining informed consent, the colonoscope                            was passed under direct vision. Throughout the                            procedure, the patient's blood pressure, pulse, and                            oxygen saturations were monitored continuously. The                            Olympus PCF-[id number] (SN#[id number]) Colonoscope was                            introduced through the anus and  advanced to the 5                            cm into the ileum. The colonoscopy was somewhat                            difficult due to a redundant colon. Successful                            completion of the procedure was aided by changing                            the patient's position, using manual pressure,                            straightening and shortening the scope to obtain                            bowel loop reduction and using scope torsion. The                            patient tolerated the procedure. The quality of the                            bowel preparation was adequate. The terminal ileum,                            ileocecal valve, appendiceal orifice, and rectum                            were photographed. Scope In: 2:32:42 PM Scope Out: 2:50:23 PM Scope Withdrawal Time: 0 hours 11 minutes 24 seconds  Total Procedure Duration: 0 hours 17 minutes 41 seconds  Findings:                 The digital rectal exam findings include                            hemorrhoids. Pertinent negatives include no                              palpable rectal lesions.                           The colon (entire examined portion) was                            significantly redundant and led to looping.                           The terminal ileum and ileocecal valve appeared                            normal.                           Two sessile polyps were found in the rectum and                            recto-sigmoid colon. The polyps were 3 to 5 mm in                            size. These polyps were removed with a cold snare.                            Resection and retrieval were complete.                           Normal mucosa was found in the entire colon                            otherwise.                           Non-bleeding non-thrombosed external and internal                            hemorrhoids were found during retroflexion, during                             perianal exam and during digital exam. The                            hemorrhoids were Grade II (internal hemorrhoids                            that prolapse but reduce spontaneously). Complications:            No immediate complications. Estimated Blood Loss:     Estimated blood loss was minimal. Impression:               - Hemorrhoids found on digital rectal exam.                           - Redundant colon leading to looping.                           -   The examined portion of the ileum was normal.                           - Two 3 to 5 mm polyps in the rectum and at the                            recto-sigmoid colon, removed with a cold snare.                            Resected and retrieved.                           - Normal mucosa in the entire examined colon                            otherwise.                           - Non-bleeding non-thrombosed external and internal                            hemorrhoids. Recommendation:           - The patient will be observed post-procedure,                            until all discharge criteria are met.                           - Discharge patient to home.                           - Patient has a contact number available for                            emergencies. The signs and symptoms of potential                            delayed complications were discussed with the                            patient. Return to normal activities tomorrow.                            Written discharge instructions were provided to the                            patient.                           - High fiber diet.                           - Use FiberCon 1-2 tablets PO daily.                           -  Continue present medications.                           - Await pathology results.                           - Repeat colonoscopy in 5 years for surveillance.                            Consider Adult Colonoscope for next procedure.                            - Will follow up laboratories from today. If LFTs                            remain elevated, then will consider continued                            monitoring vs extended LFT workup and updated                            imaging MRI/MRCP.                           - The findings and recommendations were discussed                            with the patient.                           - The findings and recommendations were discussed                            with the patient's family. Justice Britain, MD 03/01/2022 3:05:46 PM

## 2022-03-01 NOTE — Progress Notes (Signed)
GASTROENTEROLOGY PROCEDURE H&P NOTE   Primary Care Physician: Pcp, No  HPI: Taylor Chandler is a 46 y.o. female who presents for EGD/colonoscopy for evaluation of nausea with history of previous mast cell enteritis, family history of colon cancer, family history of celiac disease, abnormal CT imaging of the colon high risk colon cancer screening.  Past Medical History:  Diagnosis Date   Cancer [facility])    Breast Cancer. In remission 09/2019 Advance Stage 3 Last infusion Reclast done in August 2022   Lupus [facility])    Past Surgical History:  Procedure Laterality Date   ABDOMINAL HYSTERECTOMY     APPENDECTOMY     BREAST RECONSTRUCTION     Dip Flap surgery as well   CHOLECYSTECTOMY     MASTECTOMY Bilateral    Dip Flap surgery as well   Current Outpatient Medications  Medication Sig Dispense Refill   ALPRAZolam (XANAX) 1 MG tablet Take 1 mg by mouth 2 (two) times daily as needed.     Ascorbic Acid (VITAMIN C PO) Take 1 tablet by mouth daily in the afternoon.     buPROPion (WELLBUTRIN XL) 300 MG 24 hr tablet Take 300 mg by mouth daily.     Calcium Carb-Cholecalciferol (CALCIUM 500+D3 PO) Take 1 tablet by mouth daily in the afternoon.     Cyanocobalamin (VITAMIN B-12 PO) Take by mouth.     DULoxetine (CYMBALTA) 60 MG capsule Take 60 mg by mouth daily.     hydroxychloroquine (PLAQUENIL) 200 MG tablet Take 2 tablets by mouth daily as needed. Take Monday through Saturday     letrozole (FEMARA) 2.5 MG tablet Take 2.5 mg by mouth daily.     MAGNESIUM PO Take 1 tablet by mouth daily in the afternoon.     PEG-KCl-NaCl-NaSulf-Na Asc-C (PLENVU) 140 g SOLR Take 1 kit by mouth as directed. Use coupon: BIN: [id number] PNC: CNRX Group: [id number] ID: [id number] 1 each 0   sucralfate (CARAFATE) 1 g tablet Take 1 tablet (1 g total) by mouth 2 (two) times daily. 60 tablet 6   No current facility-administered medications for this visit.    Current Outpatient Medications:    ALPRAZolam (XANAX) 1 MG  tablet, Take 1 mg by mouth 2 (two) times daily as needed., Disp: , Rfl:    Ascorbic Acid (VITAMIN C PO), Take 1 tablet by mouth daily in the afternoon., Disp: , Rfl:    buPROPion (WELLBUTRIN XL) 300 MG 24 hr tablet, Take 300 mg by mouth daily., Disp: , Rfl:    Calcium Carb-Cholecalciferol (CALCIUM 500+D3 PO), Take 1 tablet by mouth daily in the afternoon., Disp: , Rfl:    Cyanocobalamin (VITAMIN B-12 PO), Take by mouth., Disp: , Rfl:    DULoxetine (CYMBALTA) 60 MG capsule, Take 60 mg by mouth daily., Disp: , Rfl:    hydroxychloroquine (PLAQUENIL) 200 MG tablet, Take 2 tablets by mouth daily as needed. Take Monday through Saturday, Disp: , Rfl:    letrozole (FEMARA) 2.5 MG tablet, Take 2.5 mg by mouth daily., Disp: , Rfl:    MAGNESIUM PO, Take 1 tablet by mouth daily in the afternoon., Disp: , Rfl:    PEG-KCl-NaCl-NaSulf-Na Asc-C (PLENVU) 140 g SOLR, Take 1 kit by mouth as directed. Use coupon: BIN: [id number] PNC: CNRX Group: [id number] ID: [id number], Disp: 1 each, Rfl: 0   sucralfate (CARAFATE) 1 g tablet, Take 1 tablet (1 g total) by mouth 2 (two) times daily., Disp: 60 tablet, Rfl: 6 No Known Allergies Family History  Problem Relation  Age of Onset   Breast cancer Mother    Colon cancer Father    Esophageal cancer Neg Hx    Inflammatory bowel disease Neg Hx    Liver disease Neg Hx    Pancreatic cancer Neg Hx    Stomach cancer Neg Hx    Rectal cancer Neg Hx    Social History   Socioeconomic History   Marital status: Married    Spouse name: Not on file   Number of children: Not on file   Years of education: Not on file   Highest education level: Not on file  Occupational History   Not on file  Tobacco Use   Smoking status: Never    Passive exposure: Never   Smokeless tobacco: Never  Vaping Use   Vaping Use: Never used  Substance and Sexual Activity   Alcohol use: Not Currently   Drug use: Never   Sexual activity: Not on file  Other Topics Concern   Not on file  Social  History Narrative   Not on file   Social Determinants of Health   Financial Resource Strain: Not on file  Food Insecurity: Not on file  Transportation Needs: Not on file  Physical Activity: Not on file  Stress: Not on file  Social Connections: Not on file  Intimate Partner Violence: Not on file    Physical Exam: There were no vitals filed for this visit. There is no height or weight on file to calculate BMI. GEN: NAD EYE: Sclerae anicteric ENT: MMM CV: Non-tachycardic GI: Soft, NT/ND NEURO:  Alert & Oriented x 3  Lab Results: No results for input(s): "WBC", "HGB", "HCT", "PLT" in the last 72 hours. BMET No results for input(s): "NA", "K", "CL", "CO2", "GLUCOSE", "BUN", "CREATININE", "CALCIUM" in the last 72 hours. LFT No results for input(s): "PROT", "ALBUMIN", "AST", "ALT", "ALKPHOS", "BILITOT", "BILIDIR", "IBILI" in the last 72 hours. PT/INR No results for input(s): "LABPROT", "INR" in the last 72 hours.   Impression / Plan: This is a 45 y.o.female who presents for EGD/colonoscopy for evaluation of nausea with history of previous mast cell enteritis, family history of colon cancer, family history of celiac disease, abnormal CT imaging of the colon high risk colon cancer screening.  The risks and benefits of endoscopic evaluation/treatment were discussed with the patient and/or family; these include but are not limited to the risk of perforation, infection, bleeding, missed lesions, lack of diagnosis, severe illness requiring hospitalization, as well as anesthesia and sedation related illnesses.  The patient's history has been reviewed, patient examined, no change in status, and deemed stable for procedure.  The patient and/or family is agreeable to proceed.    Gabriel Mansouraty, MD [facility] Office # [phone]  

## 2022-03-02 ENCOUNTER — Telehealth: Payer: Self-pay | Admitting: Gastroenterology

## 2022-03-02 ENCOUNTER — Telehealth: Payer: Self-pay

## 2022-03-02 ENCOUNTER — Other Ambulatory Visit: Payer: Self-pay

## 2022-03-02 DIAGNOSIS — R7989 Other specified abnormal findings of blood chemistry: Secondary | ICD-10-CM

## 2022-03-02 NOTE — Telephone Encounter (Signed)
Patient returned your call.

## 2022-03-02 NOTE — Telephone Encounter (Signed)
See results note 6/15

## 2022-03-02 NOTE — Telephone Encounter (Signed)
  Follow up Call-     03/01/2022    1:53 PM  Call back number  Post procedure Call Back phone  # [phone]  Permission to leave phone message Yes     Patient questions:  Do you have a fever, pain , or abdominal swelling? No. Pain Score  0 *  Have you tolerated food without any problems? Yes.    Have you been able to return to your normal activities? Yes.    Do you have any questions about your discharge instructions: Diet   No. Medications  No. Follow up visit  No.  Do you have questions or concerns about your Care? No.  Actions: * If pain score is 4 or above: No action needed, pain <4.

## 2022-03-04 ENCOUNTER — Telehealth: Payer: Self-pay | Admitting: Pharmacy Technician

## 2022-03-04 ENCOUNTER — Other Ambulatory Visit (HOSPITAL_COMMUNITY): Payer: Self-pay

## 2022-03-04 NOTE — Telephone Encounter (Signed)
Patient Advocate Encounter  Received notification from [place] that prior authorization for ESOMEPRAZOLE '40MG'$  is required.   PA submitted on 6.17.23 Key [id number] Status is pending    Luciano Cutter, CPhT Patient Advocate Phone: [phone]

## 2022-03-08 ENCOUNTER — Encounter: Payer: Self-pay | Admitting: Gastroenterology

## 2022-03-28 NOTE — Telephone Encounter (Signed)
Prior Authorization for ESOMEPRAZOLE '40MG'$  has been approved.    Key: [id number]  PA Case ID: [id number]  Rx #: [id number]  Effective dates: 02/02/2022 through 09/17/2098

## 2022-05-03 ENCOUNTER — Ambulatory Visit: Admitting: Internal Medicine

## 2022-05-20 IMAGING — CT CT ABD-PELV W/ CM
2 of 5 series · 15 of 46 positions shown, 17 images · IV contrast (APPLIED)
Comparison: None available

CLINICAL DATA: Back pain. History of cancer in remission. History
of lupus. History of breast cancer. Mid lower back pain. History of
hysterectomy.

EXAM:
CT ABDOMEN AND PELVIS WITH CONTRAST
TECHNIQUE: Multidetector CT imaging of the abdomen and pelvis was performed
using the standard protocol following bolus administration of
intravenous contrast.

[Series 2: abd pel w · axial · 0.71mm/px · z∈[-463,-48]mm · 12 of 93 slices shown, 14 images]
[im 5/93  soft-tissue]
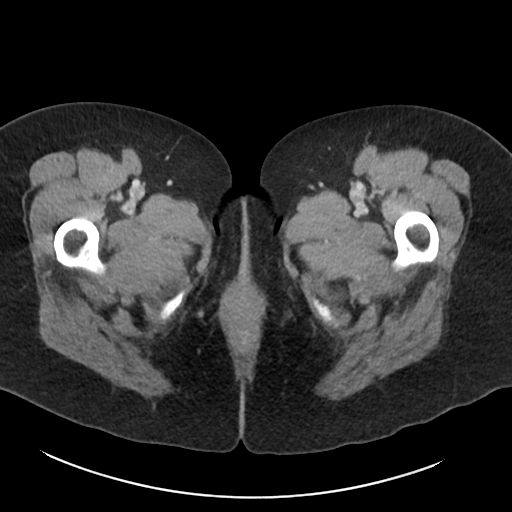
[im 5/93  bone]
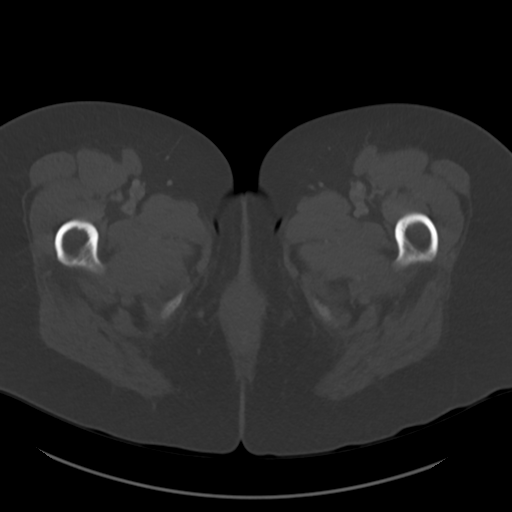
[im 14/93  soft-tissue]
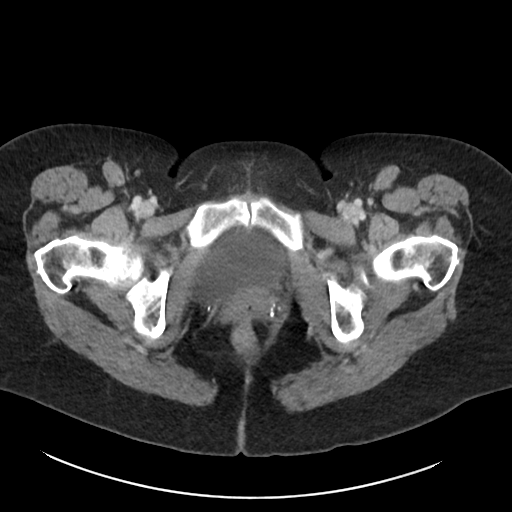
[im 19/93  soft-tissue]
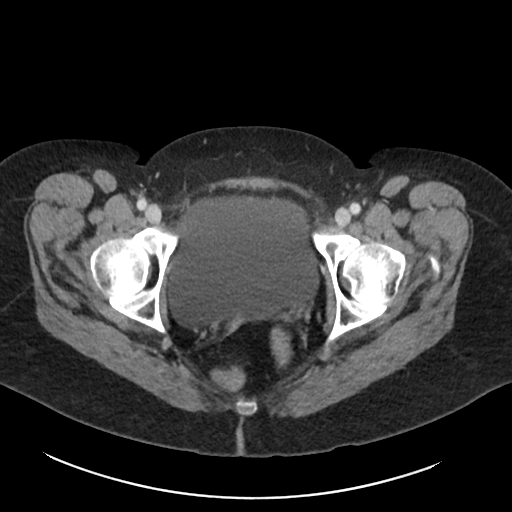
[im 28/93  soft-tissue]
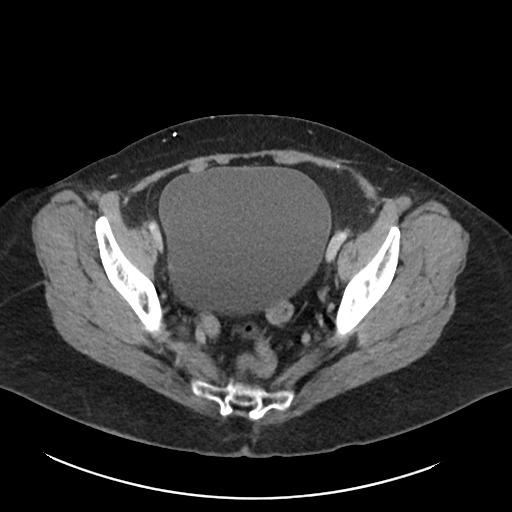
[im 37/93  soft-tissue]
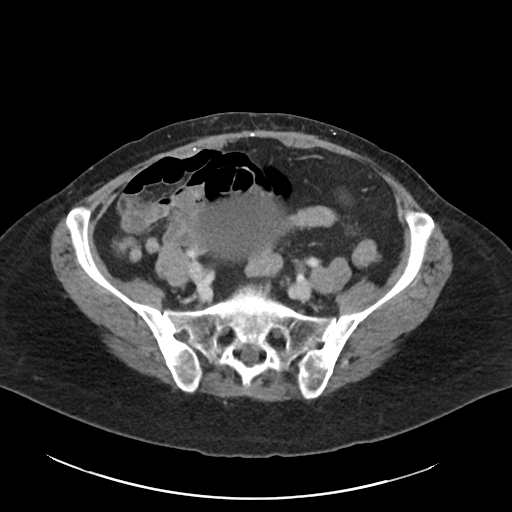
[im 42/93  soft-tissue]
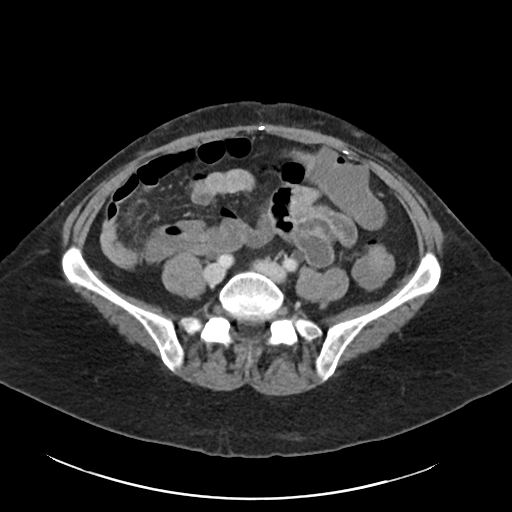
[im 51/93  soft-tissue]
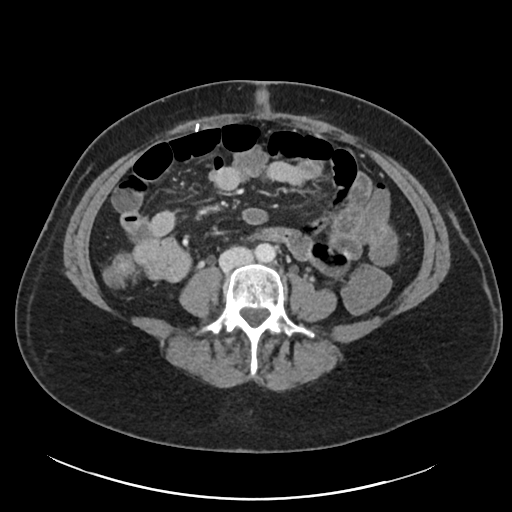
[im 56/93  soft-tissue]
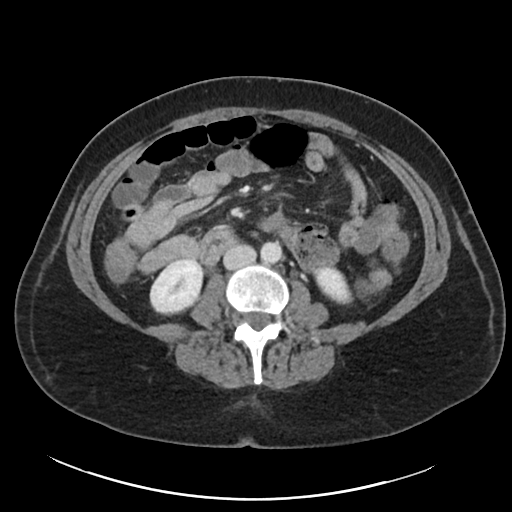
[im 65/93  soft-tissue]
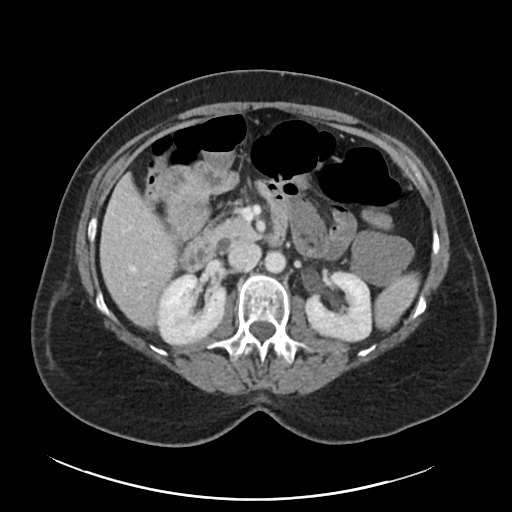
[im 65/93  bone]
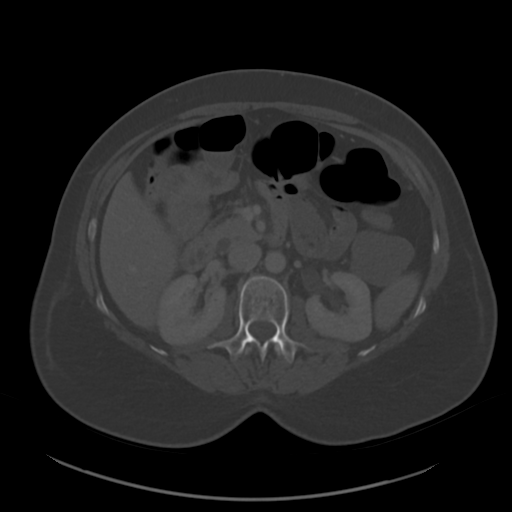
[im 74/93  soft-tissue]
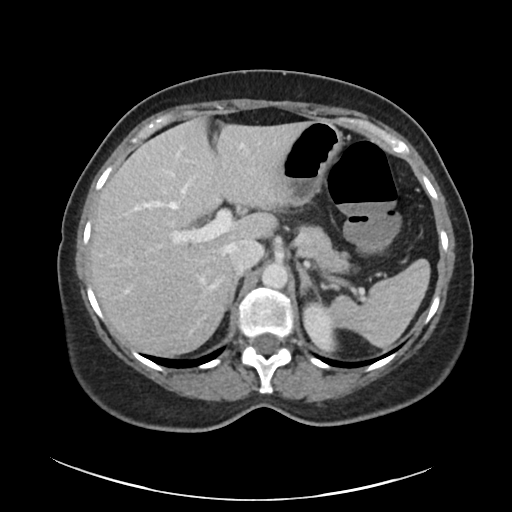
[im 79/93  soft-tissue]
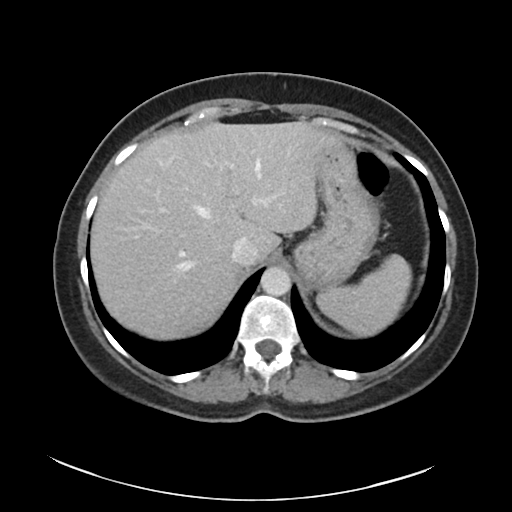
[im 88/93  soft-tissue]
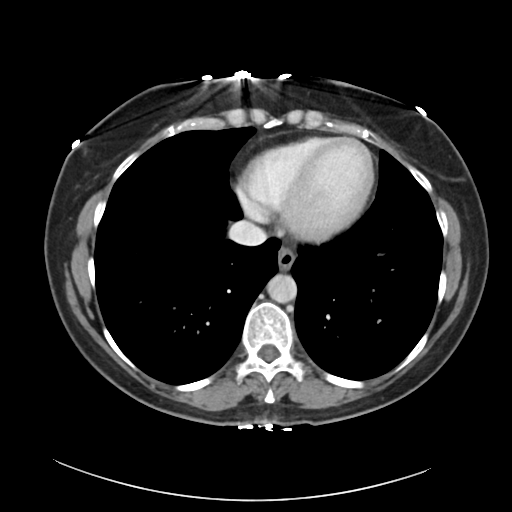

[Series 5: coronal · coronal · 0.88mm/px · 3 of 97 slices shown]
[im 33/97  soft-tissue]
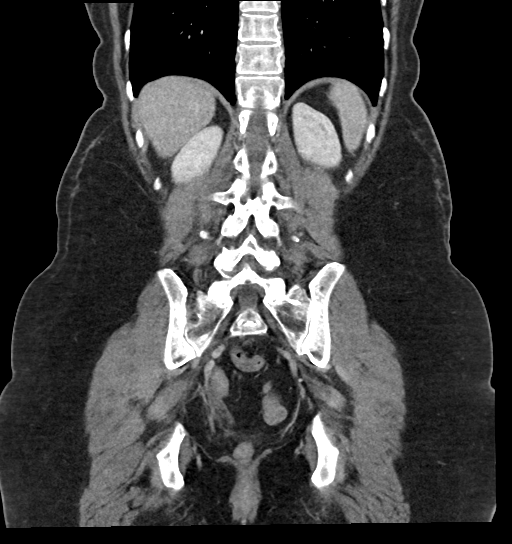
[im 43/97  soft-tissue]
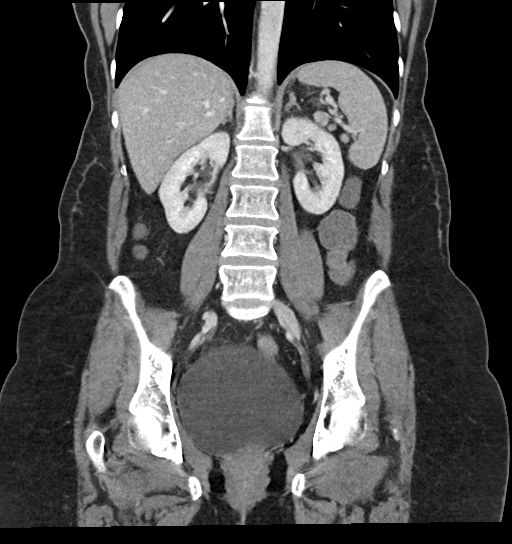
[im 54/97  soft-tissue]
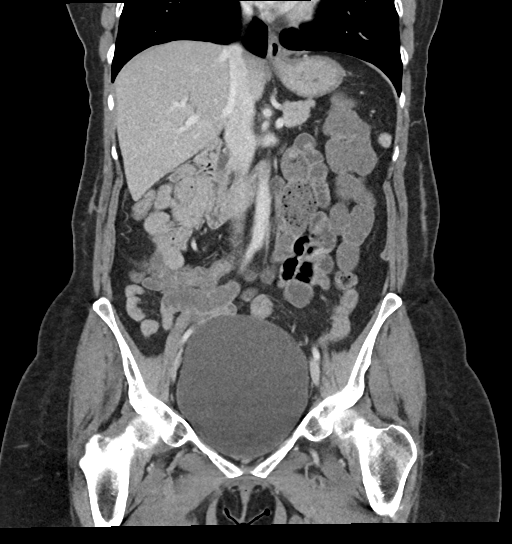

[15 of 46 positions shown; findings below may reference images not displayed]

RADIATION DOSE REDUCTION: This exam was performed according to the
departmental dose-optimization program which includes automated
exposure control, adjustment of the mA and/or kV according to
patient size and/or use of iterative reconstruction technique.

CONTRAST:  100mL OMNIPAQUE IOHEXOL 300 MG/ML  SOLN
FINDINGS: Lower chest: Lung bases are unremarkable.

Hepatobiliary: Smooth liver contours. Low-attenuation likely focal
fatty deposition adjacent to the falciform ligament benign. Status
post cholecystectomy. The common bile duct measures up to
approximately 8 mm. Minimal central intrahepatic biliary ductal
dilatation. These findings are likely secondary to reservoir effect
from the prior cholecystectomy.

Pancreas: No mass or inflammatory fat stranding. No pancreatic
ductal dilatation is seen.

Spleen: Normal in size without focal abnormality. Tiny splenule seen
inferiorly.

Adrenals/Urinary Tract: Adrenal glands are unremarkable. The kidneys
enhance uniformly and are symmetric in size without hydronephrosis.
No renal stone is seen. No renal mass is seen. No focal urinary
bladder wall thickening.

Stomach/Bowel: There air-fluid levels throughout the mid to distal
aspect of the colon. The proximal transverse colon and distal
ascending colon are decompressed. There is also fluid within the
ascending colon. The descending aspect of the ascending colon
appears to extend medially and then superiorly towards the left
5, images 53-59). There appears to be narrowing of the lumen and
thickening of this bowel wall which may represent the proximal
ascending colon, there is fecal material seen within a more
distended loop of what may represent the cecum in the left upper
quadrant (coronal image 53, axial image 37). There does appear to be
fat around the insertion of the terminal ileum in this location
(axial image 34 and coronal image 61). The appendix is not
confidently identified.

Vascular/Lymphatic: No abdominal aortic aneurysm. No mesenteric,
retroperitoneal, or pelvic lymphadenopathy.

Reproductive: The uterus is surgically absent. No gross adnexal
abnormality.

Other: Surgical clips overlie the anterior inferior peritoneal wall
including the anterior pelvis.No abdominopelvic ascites. No
pneumoperitoneum.

Musculoskeletal: No acute or significant osseous findings.
IMPRESSION: :
IMPRESSION: There air-fluid levels within the majority of the colon compatible
with diarrheal illness. There appears to be extension of the
proximal ascending colon and cecum into the left upper abdominal
quadrant. In the region of the bowel favored to represent the
proximal ascending colon extending to this left upper quadrant
region, there is luminal narrowing and wall thickening compatible
with colitis. Recommend correlation with colonoscopy history, and
consider repeat colonoscopy after the patient's symptoms reside to
ensure no underlying colon wall mass.

## 2022-06-05 ENCOUNTER — Encounter: Payer: Self-pay | Admitting: Gastroenterology

## 2022-06-28 LAB — BASIC METABOLIC PANEL
BUN: 9 (ref 4–21)
CO2: 28 — AB (ref 13–22)
Chloride: 100 (ref 99–108)
Creatinine: 0.6 (ref 0.5–1.1)
Glucose: 91
Potassium: 4.4 mEq/L (ref 3.5–5.1)

## 2022-06-28 LAB — CBC AND DIFFERENTIAL
Hemoglobin: 12.3 (ref 12.0–16.0)
Platelets: 263 10*3/uL (ref 150–400)
WBC: 5.9

## 2022-06-28 LAB — COMPREHENSIVE METABOLIC PANEL
Albumin: 4.3 (ref 3.5–5.0)
Calcium: 10.1 (ref 8.7–10.7)
Globulin: 2.6
eGFR: 112

## 2022-06-28 LAB — HEPATIC FUNCTION PANEL
ALT: 29 U/L (ref 7–35)
AST: 31 (ref 13–35)
Alkaline Phosphatase: 138 — AB (ref 25–125)
Bilirubin, Total: 0.3

## 2022-06-28 LAB — CBC: RBC: 3.81 — AB (ref 3.87–5.11)

## 2022-07-10 ENCOUNTER — Encounter: Payer: Self-pay | Admitting: Internal Medicine

## 2022-07-10 ENCOUNTER — Ambulatory Visit: Admitting: Internal Medicine

## 2022-07-10 VITALS — BP 120/84 | HR 87 | Temp 97.8°F | Ht 66.0 in | Wt 153.2 lb

## 2022-07-10 DIAGNOSIS — C679 Malignant neoplasm of bladder, unspecified: Secondary | ICD-10-CM | POA: Diagnosis not present

## 2022-07-10 DIAGNOSIS — Z6824 Body mass index (BMI) 24.0-24.9, adult: Secondary | ICD-10-CM

## 2022-07-10 DIAGNOSIS — M329 Systemic lupus erythematosus, unspecified: Secondary | ICD-10-CM | POA: Diagnosis not present

## 2022-07-10 DIAGNOSIS — Z7689 Persons encountering health services in other specified circumstances: Secondary | ICD-10-CM

## 2022-07-10 DIAGNOSIS — C779 Secondary and unspecified malignant neoplasm of lymph node, unspecified: Secondary | ICD-10-CM

## 2022-07-10 DIAGNOSIS — C50912 Malignant neoplasm of unspecified site of left female breast: Secondary | ICD-10-CM

## 2022-07-10 MED ORDER — HYDROXYCHLOROQUINE SULFATE 200 MG PO TABS: ORAL_TABLET | ORAL | 1 refills | Status: AC

## 2022-07-10 NOTE — Patient Instructions (Signed)
Exercising to Stay Healthy To become healthy and stay healthy, it is recommended that you do moderate-intensity and vigorous-intensity exercise. You can tell that you are exercising at a moderate intensity if your heart starts beating faster and you start breathing faster but can still hold a conversation. You can tell that you are exercising at a vigorous intensity if you are breathing much harder and faster and cannot hold a conversation while exercising. How can exercise benefit me? Exercising regularly is important. It has many health benefits, such as: Improving overall fitness, flexibility, and endurance. Increasing bone density. Helping with weight control. Decreasing body fat. Increasing muscle strength and endurance. Reducing stress and tension, anxiety, depression, or anger. Improving overall health. What guidelines should I follow while exercising? Before you start a new exercise program, talk with your health care provider. Do not exercise so much that you hurt yourself, feel dizzy, or get very short of breath. Wear comfortable clothes and wear shoes with good support. Drink plenty of water while you exercise to prevent dehydration or heat stroke. Work out until your breathing and your heartbeat get faster (moderate intensity). How often should I exercise? Choose an activity that you enjoy, and set realistic goals. Your health care provider can help you make an activity plan that is individually designed and works best for you. Exercise regularly as told by your health care provider. This may include: Doing strength training two times a week, such as: Lifting weights. Using resistance bands. Push-ups. Sit-ups. Yoga. Doing a certain intensity of exercise for a given amount of time. Choose from these options: A total of 150 minutes of moderate-intensity exercise every week. A total of 75 minutes of vigorous-intensity exercise every week. A mix of moderate-intensity and  vigorous-intensity exercise every week. Children, pregnant women, people who have not exercised regularly, people who are overweight, and older adults may need to talk with a health care provider about what activities are safe to perform. If you have a medical condition, be sure to talk with your health care provider before you start a new exercise program. What are some exercise ideas? Moderate-intensity exercise ideas include: Walking 1 mile (1.6 km) in about 15 minutes. Biking. Hiking. Golfing. Dancing. Water aerobics. Vigorous-intensity exercise ideas include: Walking 4.5 miles (7.2 km) or more in about 1 hour. Jogging or running 5 miles (8 km) in about 1 hour. Biking 10 miles (16.1 km) or more in about 1 hour. Lap swimming. Roller-skating or in-line skating. Cross-country skiing. Vigorous competitive sports, such as football, basketball, and soccer. Jumping rope. Aerobic dancing. What are some everyday activities that can help me get exercise? Yard work, such as: Pushing a lawn mower. Raking and bagging leaves. Washing your car. Pushing a stroller. Shoveling snow. Gardening. Washing windows or floors. How can I be more active in my day-to-day activities? Use stairs instead of an elevator. Take a walk during your lunch break. If you drive, park your car farther away from your work or school. If you take public transportation, get off one stop early and walk the rest of the way. Stand up or walk around during all of your indoor phone calls. Get up, stretch, and walk around every 30 minutes throughout the day. Enjoy exercise with a friend. Support to continue exercising will help you keep a regular routine of activity. Where to find more information You can find more information about exercising to stay healthy from: U.S. Department of Health and Human Services: www.hhs.gov Centers for Disease Control and Prevention (  CDC): www.cdc.gov Summary Exercising regularly is  important. It will improve your overall fitness, flexibility, and endurance. Regular exercise will also improve your overall health. It can help you control your weight, reduce stress, and improve your bone density. Do not exercise so much that you hurt yourself, feel dizzy, or get very short of breath. Before you start a new exercise program, talk with your health care provider. This information is not intended to replace advice given to you by your health care provider. Make sure you discuss any questions you have with your health care provider. Document Revised: 12/31/2020 Document Reviewed: 12/31/2020 Elsevier Patient Education  2023 Elsevier Inc.  

## 2022-07-10 NOTE — Progress Notes (Signed)
Rich Brave [place],acting as a Education administrator for Maximino Greenland, MD.,have documented all relevant documentation on the behalf of Maximino Greenland, MD,as directed by  Maximino Greenland, MD while in the presence of Maximino Greenland, MD.    Subjective:     Patient ID: Taylor Chandler , female    DOB: [birth year] , 46 y.o.   MRN: [id number]   Chief Complaint  Patient presents with   Establish Care   Lupus    HPI  Patient presents today to establish care. She was referred by her oncologist, Dr. Marylou Mccoy. She recently moved here from [place], [place] in January 2023. She moved here to be closer to her family.  She reports having stage 3 breast cancer in left breast s;/p double mastectomy w/ reconstructive surgery. She was also treated with chemo and XRT. Unfortunately, she had skin necrosis due to XRT and she had to have breast implants removed.  She is now followed by [facility].  She is now on letrozole. She would like to be seen for her lupus. She is needing a refill on her plaquenil and referral to Rheumatology. She states her last lupus flare was in the past month. Flares usually occur with rashes and joint pains.   She has 2 children, full term, vaginal deliveries. 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      Past Medical History:  Diagnosis Date   Adenomyosis    Cancer ([place])    Breast Cancer. In remission 09/2019 Advance Stage 3 Last infusion Reclast done in August 2022   Endometriosis    Lupus ([place])      Family History  Problem Relation Age of Onset   Breast cancer Mother    Colon cancer Father    Esophageal cancer Neg Hx    Inflammatory bowel disease Neg Hx    Liver disease Neg Hx    Pancreatic cancer Neg Hx    Stomach cancer Neg Hx    Rectal cancer Neg Hx      Current Outpatient Medications:    ALPRAZolam (XANAX) 1 MG tablet, Take 1 mg by mouth 2 (two) times daily as needed., Disp: , Rfl:    Ascorbic Acid (VITAMIN C PO), Take 1 tablet  by mouth daily in the afternoon., Disp: , Rfl:    buPROPion (WELLBUTRIN XL) 300 MG 24 hr tablet, Take 300 mg by mouth daily., Disp: , Rfl:    Calcium Carb-Cholecalciferol (CALCIUM 500+D3 PO), Take 1 tablet by mouth daily in the afternoon., Disp: , Rfl:    Cyanocobalamin (VITAMIN B-12 PO), Take by mouth., Disp: , Rfl:    DULoxetine (CYMBALTA) 60 MG capsule, Take 60 mg by mouth daily., Disp: , Rfl:    esomeprazole (NEXIUM) 40 MG capsule, Take 1 capsule (40 mg total) by mouth 2 (two) times daily before a meal., Disp: 60 capsule, Rfl: 6   letrozole (FEMARA) 2.5 MG tablet, Take 2.5 mg by mouth daily., Disp: , Rfl:    MAGNESIUM PO, Take 1 tablet by mouth daily in the afternoon., Disp: , Rfl:    sucralfate (CARAFATE) 1 g tablet, Take 1 tablet (1 g total) by mouth 2 (two) times daily. (Patient taking differently: Take 1 g by mouth daily as needed.), Disp: 60 tablet, Rfl: 6   hydroxychloroquine (PLAQUENIL) 200 MG tablet, Take 2 tabs po nightly except Sundays, Disp: 180 tablet, Rfl: 1   Allergies  Allergen Reactions   Compazine [Prochlorperazine]  Review of Systems  Constitutional: Negative.   HENT: Negative.         Hoarseness  Eyes: Negative.   Respiratory: Negative.    Cardiovascular: Negative.   Gastrointestinal: Negative.   Musculoskeletal:  Positive for arthralgias.  Skin: Negative.   Neurological: Negative.   Psychiatric/Behavioral: Negative.       Today's Vitals   07/10/22 1615  BP: 120/84  Pulse: 87  Temp: 97.8 F (36.6 C)  Weight: 153 lb 3.2 oz (69.5 kg)  Height: '5\' 6"'$  (1.676 m)  PainSc: 5    Body mass index is 24.73 kg/m.  Wt Readings from Last 3 Encounters:  07/10/22 153 lb 3.2 oz (69.5 kg)  03/01/22 161 lb (73 kg)  02/08/22 161 lb (73 kg)     Objective:  Physical Exam Vitals and nursing note reviewed.  Constitutional:      Appearance: Normal appearance.  HENT:     Head: Normocephalic and atraumatic.     Comments: hoarseness    Nose:     Comments: Masked      Mouth/Throat:     Comments: Masked  Eyes:     Extraocular Movements: Extraocular movements intact.  Cardiovascular:     Rate and Rhythm: Normal rate and regular rhythm.     Heart sounds: Normal heart sounds.  Pulmonary:     Effort: Pulmonary effort is normal.     Breath sounds: Normal breath sounds.  Musculoskeletal:     Cervical back: Normal range of motion.  Skin:    General: Skin is warm.  Neurological:     General: No focal deficit present.     Mental Status: She is alert.  Psychiatric:        Mood and Affect: Mood normal.        Behavior: Behavior normal.       Assessment And Plan:     1. Lupus ([place]) Comments: Chronic, I will refer her to Rheum for further mgmt. Plaquenil refilled. I will also refer her to Ophthalmology due to chronic Plaquenil usage. I will abstract labs from October 2023 ER visit at [facility] in [place].  - Ambulatory referral to Rheumatology - Ambulatory referral to Ophthalmology  2. 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 She is s/p b/l mastectomy w/ reconstruction, she had chemo along with XRT. Currently on letrozole and adjuvant bisphosphonate therapy with Zometa.  Her last dexa was in 2022.   3. Malignant neoplasm of urinary bladder, unspecified site [facility]) Comments: She did not offer this diagnosis, reviewed in Care Everywhere. Diagnosed in 2019, stage IIIa invasive bladder carcinoma.   4. BMI 24.0-24.9, adult Comments: She is encouraged to aim for at least 150 minutes of exercise per week.   5. Establishing care with new doctor, encounter for  I will request records from her previous PCP and Rheumatologist.   Patient was given opportunity to ask questions. Patient verbalized understanding of the plan and was able to repeat key elements of the plan. All questions were answered to  their satisfaction.   I, Maximino Greenland, MD, have reviewed all documentation for this visit. The documentation on 07/10/22 for the exam, diagnosis, procedures, and orders are all accurate and complete.   IF YOU HAVE BEEN REFERRED TO A SPECIALIST, IT MAY TAKE 1-2 WEEKS TO SCHEDULE/PROCESS THE REFERRAL. IF YOU HAVE NOT HEARD FROM US/SPECIALIST IN TWO WEEKS,  PLEASE GIVE [place] A CALL AT [phone] X [phone].   THE PATIENT IS ENCOURAGED TO PRACTICE SOCIAL DISTANCING DUE TO THE COVID-19 PANDEMIC.

## 2022-07-12 ENCOUNTER — Telehealth: Payer: Self-pay

## 2022-07-12 ENCOUNTER — Encounter: Payer: Self-pay | Admitting: Internal Medicine

## 2022-07-12 NOTE — Telephone Encounter (Signed)
Called patient to get information about her previous PCP and previous rheumatologist.  Previous PCP was [place] at [phone] in [place] Previous Rheumatologist - Dr. Gaye Alken. [place] at [facility]- [phone]. Patient was emailed a record request form to complete. Letter were sent to previous facility.

## 2022-07-13 ENCOUNTER — Encounter: Payer: Self-pay | Admitting: Internal Medicine

## 2023-01-11 ENCOUNTER — Encounter: Admitting: Internal Medicine

## 2023-02-16 ENCOUNTER — Ambulatory Visit: Admitting: Plastic Surgery

## 2023-02-16 ENCOUNTER — Encounter: Payer: Self-pay | Admitting: Plastic Surgery

## 2023-02-16 VITALS — BP 137/91 | HR 96 | Ht 66.0 in | Wt 159.8 lb

## 2023-02-16 DIAGNOSIS — N651 Disproportion of reconstructed breast: Secondary | ICD-10-CM | POA: Diagnosis not present

## 2023-02-16 DIAGNOSIS — M329 Systemic lupus erythematosus, unspecified: Secondary | ICD-10-CM

## 2023-02-16 DIAGNOSIS — Z9889 Other specified postprocedural states: Secondary | ICD-10-CM | POA: Insufficient documentation

## 2023-02-16 DIAGNOSIS — Z923 Personal history of irradiation: Secondary | ICD-10-CM

## 2023-02-16 DIAGNOSIS — Z9013 Acquired absence of bilateral breasts and nipples: Secondary | ICD-10-CM | POA: Diagnosis not present

## 2023-02-16 DIAGNOSIS — Z853 Personal history of malignant neoplasm of breast: Secondary | ICD-10-CM | POA: Diagnosis not present

## 2023-02-16 NOTE — Progress Notes (Signed)
Patient ID: Taylor Chandler, female    DOB: 1976/04/16, 47 y.o.   MRN: [id number]   Chief Complaint  Patient presents with   Consult         Breast Problem    The patient is a 47 year old female here for evaluation for breast reconstruction.  She was treated for left invasive lobular carcinoma in 2019.  It was documented to be a stage IIIa, pT3 N1a M0, ER and PR positive HER2 negative with a Ki-67 of 15%.  She underwent bilateral mastectomies with expander placement for her reconstruction.  She then had radiation to the left breast.  Then had the expanders removed and implants placed.  Shortly thereafter the left breast skin turned black.  The decision was made quickly that the implants needed to be removed.  She underwent bilateral Diep flap reconstruction.  This was done in [place].  She then had a revision with some fat grafting.  She was with chemotherapy and is still on letrozole.  She was also treated in 2019 for invasive bladder carcinoma according to her records.  She has overall a very nice reconstruction.  The abdomen healed well and no sign of skin breakdown.  She did end up losing her umbilicus.  She does have some asymmetry with the left breast being a little bit smaller and tighter.  She would like to see if there is anything she can do that safe to get a little better symmetry.  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  The notes also indicate that she has lupus.      Review of Systems  Constitutional: Negative.   HENT: Negative.    Eyes: Negative.   Respiratory: Negative.    Cardiovascular: Negative.   Gastrointestinal: Negative.   Endocrine: Negative.   Genitourinary: Negative.   Musculoskeletal: Negative.     Past Medical History:  Diagnosis Date   Adenomyosis    Cancer (H[facility])    Breast Cancer. In remission 09/2019 Advance Stage 3 Last infusion Reclast done in August  2022   Endometriosis    Lupus [facility])     Past Surgical History:  Procedure Laterality Date   ABDOMINAL HYSTERECTOMY  11/1997   APPENDECTOMY  1983   BREAST RECONSTRUCTION  02/2021   Dip Flap surgery as well   CHOLECYSTECTOMY  1994   LAPAROSCOPIC SALPINGOOPHERECTOMY  09/2018   MASTECTOMY Bilateral 07/30/2018   Dip Flap surgery as well   reconstructive breast surgery  11/2019      Current Outpatient Medications:    ALPRAZolam (XANAX) 1 MG tablet, Take 1 mg by mouth 2 (two) times daily as needed., Disp: , Rfl:    Ascorbic Acid (VITAMIN C PO), Take 1 tablet by mouth daily in the afternoon., Disp: , Rfl:    buPROPion (WELLBUTRIN XL) 300 MG 24 hr tablet, Take 300 mg by mouth daily., Disp: , Rfl:    Calcium Carb-Cholecalciferol (CALCIUM 500+D3 PO), Take 1 tablet by mouth daily in the afternoon., Disp: , Rfl:    Cyanocobalamin (VITAMIN B-12 PO), Take by mouth., Disp: , Rfl:    DULoxetine (CYMBALTA) 60 MG capsule, Take 60 mg by mouth daily., Disp: , Rfl:    esomeprazole (NEXIUM) 40 MG capsule, Take 1 capsule (40 mg total) by mouth 2 (two) times daily before a meal., Disp: 60 capsule, Rfl: 6   hydroxychloroquine (PLAQUENIL)  200 MG tablet, Take 2 tabs po nightly except Sundays, Disp: 180 tablet, Rfl: 1   letrozole (FEMARA) 2.5 MG tablet, Take 2.5 mg by mouth daily., Disp: , Rfl:    MAGNESIUM PO, Take 1 tablet by mouth daily in the afternoon., Disp: , Rfl:    sucralfate (CARAFATE) 1 g tablet, Take 1 tablet (1 g total) by mouth 2 (two) times daily. (Patient taking differently: Take 1 g by mouth daily as needed.), Disp: 60 tablet, Rfl: 6   Objective:   Vitals:   02/16/23 0950  BP: (!) 137/91  Pulse: 96  SpO2: 100%    Physical Exam Vitals and nursing note reviewed.  Constitutional:      Appearance: Normal appearance.  HENT:     Head: Normocephalic and atraumatic.  Cardiovascular:     Rate and Rhythm: Normal rate.     Pulses: Normal pulses.  Pulmonary:     Effort: Pulmonary effort is  normal.  Abdominal:     Palpations: Abdomen is soft.  Musculoskeletal:        General: No swelling or deformity.  Skin:    General: Skin is warm.     Capillary Refill: Capillary refill takes less than 2 seconds.  Neurological:     Mental Status: She is alert and oriented to person, place, and time.  Psychiatric:        Mood and Affect: Mood normal.        Behavior: Behavior normal.        Thought Content: Thought content normal.     Assessment & Plan:  Lupus (HCC)  History of breast cancer  History of breast reconstruction  History of mastectomy, bilateral  Due to her complicated history of reconstruction as well as her current medical conditions we would need to be very careful not try to do too much at 1 time.  I think that she is a good candidate for Lipo filling of the superior portion of both breast with liposuction of the right breast for reduction and trying to get them more symmetric.  She may also need some skin excised from the right breast.  Once that is done she is a really good candidate for nipple areola tattooing.  Pictures were obtained of the patient and placed in the chart with the patient's or guardian's permission.   Alena Bills Antionette Luster, DO

## 2023-02-26 ENCOUNTER — Emergency Department (HOSPITAL_COMMUNITY)

## 2023-02-26 ENCOUNTER — Inpatient Hospital Stay (HOSPITAL_COMMUNITY)
Admission: EM | Admit: 2023-02-26 | Discharge: 2023-03-06 | DRG: 460 | Disposition: A | Discharge status: Home / Self Care | Attending: General Surgery | Admitting: General Surgery

## 2023-02-26 ENCOUNTER — Other Ambulatory Visit: Payer: Self-pay

## 2023-02-26 DIAGNOSIS — K5909 Other constipation: Secondary | ICD-10-CM | POA: Diagnosis not present

## 2023-02-26 DIAGNOSIS — Z853 Personal history of malignant neoplasm of breast: Secondary | ICD-10-CM

## 2023-02-26 DIAGNOSIS — R7401 Elevation of levels of liver transaminase levels: Secondary | ICD-10-CM

## 2023-02-26 DIAGNOSIS — Y905 Blood alcohol level of 100-119 mg/100 ml: Secondary | ICD-10-CM | POA: Diagnosis present

## 2023-02-26 DIAGNOSIS — Z9079 Acquired absence of other genital organ(s): Secondary | ICD-10-CM

## 2023-02-26 DIAGNOSIS — R0902 Hypoxemia: Secondary | ICD-10-CM | POA: Diagnosis not present

## 2023-02-26 DIAGNOSIS — E871 Hypo-osmolality and hyponatremia: Secondary | ICD-10-CM | POA: Diagnosis not present

## 2023-02-26 DIAGNOSIS — S22081A Stable burst fracture of T11-T12 vertebra, initial encounter for closed fracture: Secondary | ICD-10-CM | POA: Diagnosis present

## 2023-02-26 DIAGNOSIS — Z79811 Long term (current) use of aromatase inhibitors: Secondary | ICD-10-CM | POA: Diagnosis not present

## 2023-02-26 DIAGNOSIS — F1090 Alcohol use, unspecified, uncomplicated: Secondary | ICD-10-CM | POA: Diagnosis present

## 2023-02-26 DIAGNOSIS — S32010A Wedge compression fracture of first lumbar vertebra, initial encounter for closed fracture: Secondary | ICD-10-CM

## 2023-02-26 DIAGNOSIS — Z90710 Acquired absence of both cervix and uterus: Secondary | ICD-10-CM

## 2023-02-26 DIAGNOSIS — D62 Acute posthemorrhagic anemia: Secondary | ICD-10-CM | POA: Diagnosis not present

## 2023-02-26 DIAGNOSIS — S27322A Contusion of lung, bilateral, initial encounter: Secondary | ICD-10-CM

## 2023-02-26 DIAGNOSIS — Z9013 Acquired absence of bilateral breasts and nipples: Secondary | ICD-10-CM

## 2023-02-26 DIAGNOSIS — S32012A Unstable burst fracture of first lumbar vertebra, initial encounter for closed fracture: Principal | ICD-10-CM | POA: Diagnosis present

## 2023-02-26 DIAGNOSIS — Z79890 Hormone replacement therapy (postmenopausal): Secondary | ICD-10-CM

## 2023-02-26 DIAGNOSIS — S22009A Unspecified fracture of unspecified thoracic vertebra, initial encounter for closed fracture: Secondary | ICD-10-CM

## 2023-02-26 DIAGNOSIS — R509 Fever, unspecified: Secondary | ICD-10-CM | POA: Diagnosis not present

## 2023-02-26 DIAGNOSIS — Z803 Family history of malignant neoplasm of breast: Secondary | ICD-10-CM

## 2023-02-26 DIAGNOSIS — S32019A Unspecified fracture of first lumbar vertebra, initial encounter for closed fracture: Secondary | ICD-10-CM

## 2023-02-26 DIAGNOSIS — Z79899 Other long term (current) drug therapy: Secondary | ICD-10-CM

## 2023-02-26 DIAGNOSIS — Z9049 Acquired absence of other specified parts of digestive tract: Secondary | ICD-10-CM

## 2023-02-26 DIAGNOSIS — Z981 Arthrodesis status: Secondary | ICD-10-CM

## 2023-02-26 DIAGNOSIS — Z90722 Acquired absence of ovaries, bilateral: Secondary | ICD-10-CM | POA: Diagnosis not present

## 2023-02-26 DIAGNOSIS — M329 Systemic lupus erythematosus, unspecified: Secondary | ICD-10-CM | POA: Diagnosis present

## 2023-02-26 DIAGNOSIS — Z888 Allergy status to other drugs, medicaments and biological substances status: Secondary | ICD-10-CM | POA: Diagnosis not present

## 2023-02-26 DIAGNOSIS — Y92410 Unspecified street and highway as the place of occurrence of the external cause: Secondary | ICD-10-CM

## 2023-02-26 LAB — COMPREHENSIVE METABOLIC PANEL
ALT: 50 U/L — ABNORMAL HIGH (ref 0–44)
AST: 73 U/L — ABNORMAL HIGH (ref 15–41)
Albumin: 3.3 g/dL — ABNORMAL LOW (ref 3.5–5.0)
Alkaline Phosphatase: 76 U/L (ref 38–126)
Anion gap: 17 — ABNORMAL HIGH (ref 5–15)
BUN: 11 mg/dL (ref 6–20)
CO2: 20 mmol/L — ABNORMAL LOW (ref 22–32)
Calcium: 8.4 mg/dL — ABNORMAL LOW (ref 8.9–10.3)
Chloride: 100 mmol/L (ref 98–111)
Creatinine, Ser: 0.79 mg/dL (ref 0.44–1.00)
GFR, Estimated: >60 mL/min (ref >60)
Glucose, Bld: 114 mg/dL — ABNORMAL HIGH (ref 70–99)
Potassium: 3.4 mmol/L — ABNORMAL LOW (ref 3.5–5.1)
Sodium: 137 mmol/L (ref 135–145)
Total Bilirubin: 0.3 mg/dL (ref 0.3–1.2)
Total Protein: 6.1 g/dL — ABNORMAL LOW (ref 6.5–8.1)

## 2023-02-26 LAB — CBC
HCT: 39.3 % (ref 36.0–46.0)
Hemoglobin: 12.9 g/dL (ref 12.0–15.0)
MCH: 32.1 pg (ref 26.0–34.0)
MCHC: 32.8 g/dL (ref 30.0–36.0)
MCV: 97.8 fL (ref 80.0–100.0)
Platelets: 291 10*3/uL (ref 150–400)
RBC: 4.02 MIL/uL (ref 3.87–5.11)
RDW: 12.5 % (ref 11.5–15.5)
WBC: 5.4 10*3/uL (ref 4.0–10.5)
nRBC: 0 % (ref 0.0–0.2)

## 2023-02-26 LAB — PROTIME-INR
INR: 0.9 (ref 0.8–1.2)
Prothrombin Time: 12.8 seconds (ref 11.4–15.2)

## 2023-02-26 LAB — SAMPLE TO BLOOD BANK

## 2023-02-26 LAB — LACTIC ACID, PLASMA: Lactic Acid, Venous: 3.1 mmol/L (ref 0.5–1.9)

## 2023-02-26 LAB — I-STAT CHEM 8, ED
BUN: 14 mg/dL (ref 6–20)
Calcium, Ion: 0.97 mmol/L — ABNORMAL LOW (ref 1.15–1.40)
Chloride: 106 mmol/L (ref 98–111)
Creatinine, Ser: 1 mg/dL (ref 0.44–1.00)
Glucose, Bld: 109 mg/dL — ABNORMAL HIGH (ref 70–99)
HCT: 40 % (ref 36.0–46.0)
Hemoglobin: 13.6 g/dL (ref 12.0–15.0)
Potassium: 4.2 mmol/L (ref 3.5–5.1)
Sodium: 136 mmol/L (ref 135–145)
TCO2: 22 mmol/L (ref 22–32)

## 2023-02-26 LAB — ETHANOL: Alcohol, Ethyl (B): 101 mg/dL — ABNORMAL HIGH (ref <10)

## 2023-02-26 MED ORDER — FENTANYL CITRATE PF 50 MCG/ML IJ SOSY
PREFILLED_SYRINGE | INTRAMUSCULAR | Status: AC
  Filled 2023-02-26: qty 1

## 2023-02-26 MED ORDER — IOHEXOL 350 MG/ML SOLN
75.0000 mL | Freq: Once | INTRAVENOUS | Status: AC | PRN
  Administered 2023-02-26: 75 mL via INTRAVENOUS

## 2023-02-26 MED ORDER — LACTATED RINGERS IV BOLUS
1000.0000 mL | Freq: Once | INTRAVENOUS | Status: AC
  Administered 2023-02-26: 1000 mL via INTRAVENOUS

## 2023-02-26 MED ORDER — FENTANYL CITRATE PF 50 MCG/ML IJ SOSY
50.0000 ug | PREFILLED_SYRINGE | Freq: Once | INTRAMUSCULAR | Status: AC
  Administered 2023-02-26: 50 ug via INTRAVENOUS

## 2023-02-26 MED ORDER — HYDROMORPHONE HCL 1 MG/ML IJ SOLN
0.5000 mg | Freq: Once | INTRAMUSCULAR | Status: AC
  Administered 2023-02-26: 0.5 mg via INTRAVENOUS
  Filled 2023-02-26: qty 1

## 2023-02-26 MED ORDER — ONDANSETRON HCL 4 MG/2ML IJ SOLN: 4.0000 mg | Freq: Four times a day (QID) | INTRAMUSCULAR | Status: DC | PRN

## 2023-02-26 NOTE — ED Notes (Signed)
Consult to trauma surgery 

## 2023-02-26 NOTE — Progress Notes (Signed)
Orthopedic Tech Progress Note Patient Details:  Taylor Chandler 20-Feb-1976 [id number]  Patient ID: Taylor Chandler, female   DOB: [birth year], 47 y.o.   MRN: [id number] I attended trauma page. [name] 02/26/2023, 10:00 PM
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
## 2023-02-26 NOTE — ED Notes (Signed)
Patient transported back from CT 

## 2023-02-26 NOTE — ED Notes (Signed)
Patient transported to CT 

## 2023-02-27 ENCOUNTER — Encounter (HOSPITAL_COMMUNITY): Payer: Self-pay | Admitting: General Surgery

## 2023-02-27 ENCOUNTER — Other Ambulatory Visit: Payer: Self-pay | Admitting: Neurological Surgery

## 2023-02-27 DIAGNOSIS — Z888 Allergy status to other drugs, medicaments and biological substances status: Secondary | ICD-10-CM | POA: Diagnosis not present

## 2023-02-27 DIAGNOSIS — S32019A Unspecified fracture of first lumbar vertebra, initial encounter for closed fracture: Secondary | ICD-10-CM | POA: Diagnosis present

## 2023-02-27 DIAGNOSIS — E039 Hypothyroidism, unspecified: Secondary | ICD-10-CM | POA: Diagnosis not present

## 2023-02-27 DIAGNOSIS — Z803 Family history of malignant neoplasm of breast: Secondary | ICD-10-CM | POA: Diagnosis not present

## 2023-02-27 DIAGNOSIS — F1090 Alcohol use, unspecified, uncomplicated: Secondary | ICD-10-CM | POA: Diagnosis present

## 2023-02-27 DIAGNOSIS — E871 Hypo-osmolality and hyponatremia: Secondary | ICD-10-CM | POA: Diagnosis not present

## 2023-02-27 DIAGNOSIS — Z9013 Acquired absence of bilateral breasts and nipples: Secondary | ICD-10-CM | POA: Diagnosis not present

## 2023-02-27 DIAGNOSIS — R7401 Elevation of levels of liver transaminase levels: Secondary | ICD-10-CM | POA: Diagnosis present

## 2023-02-27 DIAGNOSIS — R509 Fever, unspecified: Secondary | ICD-10-CM | POA: Diagnosis not present

## 2023-02-27 DIAGNOSIS — Z853 Personal history of malignant neoplasm of breast: Secondary | ICD-10-CM | POA: Diagnosis not present

## 2023-02-27 DIAGNOSIS — D62 Acute posthemorrhagic anemia: Secondary | ICD-10-CM | POA: Diagnosis not present

## 2023-02-27 DIAGNOSIS — S32011A Stable burst fracture of first lumbar vertebra, initial encounter for closed fracture: Secondary | ICD-10-CM | POA: Diagnosis not present

## 2023-02-27 DIAGNOSIS — Z79890 Hormone replacement therapy (postmenopausal): Secondary | ICD-10-CM | POA: Diagnosis not present

## 2023-02-27 DIAGNOSIS — Z79811 Long term (current) use of aromatase inhibitors: Secondary | ICD-10-CM | POA: Diagnosis not present

## 2023-02-27 DIAGNOSIS — Z9049 Acquired absence of other specified parts of digestive tract: Secondary | ICD-10-CM | POA: Diagnosis not present

## 2023-02-27 DIAGNOSIS — S32012A Unstable burst fracture of first lumbar vertebra, initial encounter for closed fracture: Secondary | ICD-10-CM | POA: Diagnosis present

## 2023-02-27 DIAGNOSIS — S22081A Stable burst fracture of T11-T12 vertebra, initial encounter for closed fracture: Secondary | ICD-10-CM | POA: Diagnosis present

## 2023-02-27 DIAGNOSIS — Z90710 Acquired absence of both cervix and uterus: Secondary | ICD-10-CM | POA: Diagnosis not present

## 2023-02-27 DIAGNOSIS — R0902 Hypoxemia: Secondary | ICD-10-CM | POA: Diagnosis not present

## 2023-02-27 DIAGNOSIS — K5909 Other constipation: Secondary | ICD-10-CM | POA: Diagnosis present

## 2023-02-27 DIAGNOSIS — Z79899 Other long term (current) drug therapy: Secondary | ICD-10-CM | POA: Diagnosis not present

## 2023-02-27 DIAGNOSIS — Y92410 Unspecified street and highway as the place of occurrence of the external cause: Secondary | ICD-10-CM | POA: Diagnosis not present

## 2023-02-27 DIAGNOSIS — M329 Systemic lupus erythematosus, unspecified: Secondary | ICD-10-CM | POA: Diagnosis present

## 2023-02-27 DIAGNOSIS — Y905 Blood alcohol level of 100-119 mg/100 ml: Secondary | ICD-10-CM | POA: Diagnosis present

## 2023-02-27 DIAGNOSIS — S27322A Contusion of lung, bilateral, initial encounter: Secondary | ICD-10-CM | POA: Diagnosis present

## 2023-02-27 DIAGNOSIS — F419 Anxiety disorder, unspecified: Secondary | ICD-10-CM | POA: Diagnosis not present

## 2023-02-27 DIAGNOSIS — Z9079 Acquired absence of other genital organ(s): Secondary | ICD-10-CM | POA: Diagnosis not present

## 2023-02-27 DIAGNOSIS — Z90722 Acquired absence of ovaries, bilateral: Secondary | ICD-10-CM | POA: Diagnosis not present

## 2023-02-27 LAB — URINALYSIS, ROUTINE W REFLEX MICROSCOPIC
Bacteria, UA: NONE SEEN
Bilirubin Urine: NEGATIVE
Glucose, UA: NEGATIVE mg/dL
Hgb urine dipstick: NEGATIVE
Ketones, ur: NEGATIVE mg/dL
Nitrite: NEGATIVE
Protein, ur: NEGATIVE mg/dL
Specific Gravity, Urine: 1.03 (ref 1.005–1.030)
pH: 6 (ref 5.0–8.0)

## 2023-02-27 LAB — CBC
HCT: 35.8 % — ABNORMAL LOW (ref 36.0–46.0)
Hemoglobin: 11.8 g/dL — ABNORMAL LOW (ref 12.0–15.0)
MCH: 31.2 pg (ref 26.0–34.0)
MCHC: 33 g/dL (ref 30.0–36.0)
MCV: 94.7 fL (ref 80.0–100.0)
Platelets: 225 10*3/uL (ref 150–400)
RBC: 3.78 MIL/uL — ABNORMAL LOW (ref 3.87–5.11)
RDW: 12.5 % (ref 11.5–15.5)
WBC: 9.2 10*3/uL (ref 4.0–10.5)
nRBC: 0 % (ref 0.0–0.2)

## 2023-02-27 LAB — BASIC METABOLIC PANEL
Anion gap: 8 (ref 5–15)
BUN: 9 mg/dL (ref 6–20)
CO2: 22 mmol/L (ref 22–32)
Calcium: 7.9 mg/dL — ABNORMAL LOW (ref 8.9–10.3)
Chloride: 104 mmol/L (ref 98–111)
Creatinine, Ser: 0.63 mg/dL (ref 0.44–1.00)
GFR, Estimated: >60 mL/min (ref >60)
Glucose, Bld: 143 mg/dL — ABNORMAL HIGH (ref 70–99)
Potassium: 4.2 mmol/L (ref 3.5–5.1)
Sodium: 134 mmol/L — ABNORMAL LOW (ref 135–145)

## 2023-02-27 LAB — RAPID URINE DRUG SCREEN, HOSP PERFORMED
Amphetamines: NOT DETECTED
Barbiturates: NOT DETECTED
Benzodiazepines: POSITIVE — AB
Cocaine: NOT DETECTED
Opiates: POSITIVE — AB
Tetrahydrocannabinol: NOT DETECTED

## 2023-02-27 LAB — HIV ANTIBODY (ROUTINE TESTING W REFLEX): HIV Screen 4th Generation wRfx: NONREACTIVE

## 2023-02-27 MED ORDER — HYDROMORPHONE HCL 1 MG/ML IJ SOLN
1.0000 mg | INTRAMUSCULAR | Status: DC | PRN
  Administered 2023-02-27: 1 mg via INTRAVENOUS
  Administered 2023-02-28: 2 mg via INTRAVENOUS
  Administered 2023-02-28: 1 mg via INTRAVENOUS
  Filled 2023-02-27 (×2): qty 1
  Filled 2023-02-27: qty 2

## 2023-02-27 MED ORDER — LORAZEPAM 1 MG PO TABS
0.0000 mg | ORAL_TABLET | Freq: Four times a day (QID) | ORAL | Status: AC
  Administered 2023-02-27: 2 mg via ORAL
  Filled 2023-02-27: qty 2

## 2023-02-27 MED ORDER — ONDANSETRON HCL 4 MG/2ML IJ SOLN: 4.0000 mg | Freq: Four times a day (QID) | INTRAMUSCULAR | Status: DC | PRN

## 2023-02-27 MED ORDER — METHOCARBAMOL 1000 MG/10ML IJ SOLN
1000.0000 mg | Freq: Three times a day (TID) | INTRAVENOUS | Status: DC
  Administered 2023-02-27 (×2): 1000 mg via INTRAVENOUS
  Filled 2023-02-27: qty 1000
  Filled 2023-02-27 (×3): qty 10
  Filled 2023-02-27: qty 1000

## 2023-02-27 MED ORDER — OXYCODONE HCL 5 MG PO TABS: 10.0000 mg | ORAL_TABLET | ORAL | Status: DC | PRN

## 2023-02-27 MED ORDER — OXYCODONE HCL 5 MG PO TABS
5.0000 mg | ORAL_TABLET | ORAL | Status: DC | PRN
  Administered 2023-02-27: 5 mg via ORAL
  Filled 2023-02-27: qty 1

## 2023-02-27 MED ORDER — FOLIC ACID 1 MG PO TABS
1.0000 mg | ORAL_TABLET | Freq: Every day | ORAL | Status: DC
  Administered 2023-02-27 – 2023-03-06 (×8): 1 mg via ORAL
  Filled 2023-02-27 (×8): qty 1

## 2023-02-27 MED ORDER — DOCUSATE SODIUM 100 MG PO CAPS
100.0000 mg | ORAL_CAPSULE | Freq: Two times a day (BID) | ORAL | Status: DC
  Administered 2023-02-27 – 2023-02-28 (×4): 100 mg via ORAL
  Filled 2023-02-27 (×4): qty 1

## 2023-02-27 MED ORDER — POLYETHYLENE GLYCOL 3350 17 G PO PACK: 17.0000 g | PACK | Freq: Every day | ORAL | Status: DC | PRN

## 2023-02-27 MED ORDER — KETOROLAC TROMETHAMINE 15 MG/ML IJ SOLN
30.0000 mg | Freq: Four times a day (QID) | INTRAMUSCULAR | Status: DC
  Administered 2023-02-27 – 2023-02-28 (×3): 30 mg via INTRAVENOUS
  Filled 2023-02-27 (×3): qty 2

## 2023-02-27 MED ORDER — THIAMINE HCL 100 MG/ML IJ SOLN: 100.0000 mg | Freq: Every day | INTRAMUSCULAR | Status: DC

## 2023-02-27 MED ORDER — METOPROLOL TARTRATE 5 MG/5ML IV SOLN: 5.0000 mg | Freq: Four times a day (QID) | INTRAVENOUS | Status: DC | PRN

## 2023-02-27 MED ORDER — LORAZEPAM 1 MG PO TABS
0.0000 mg | ORAL_TABLET | Freq: Two times a day (BID) | ORAL | Status: AC
  Filled 2023-02-27: qty 1

## 2023-02-27 MED ORDER — ACETAMINOPHEN 500 MG PO TABS
1000.0000 mg | ORAL_TABLET | Freq: Four times a day (QID) | ORAL | Status: DC
  Administered 2023-02-27 – 2023-02-28 (×6): 1000 mg via ORAL
  Filled 2023-02-27 (×6): qty 2

## 2023-02-27 MED ORDER — HYDROMORPHONE HCL 1 MG/ML IJ SOLN
1.0000 mg | INTRAMUSCULAR | Status: DC | PRN
  Administered 2023-02-27 (×7): 1 mg via INTRAVENOUS
  Filled 2023-02-27 (×7): qty 1

## 2023-02-27 MED ORDER — HYDROMORPHONE HCL 1 MG/ML IJ SOLN: 1.0000 mg | INTRAMUSCULAR | Status: DC | PRN

## 2023-02-27 MED ORDER — LIDOCAINE 5 % EX PTCH
2.0000 | MEDICATED_PATCH | CUTANEOUS | Status: DC
  Administered 2023-02-27 – 2023-03-04 (×6): 2 via TRANSDERMAL
  Filled 2023-02-27 (×7): qty 2

## 2023-02-27 MED ORDER — LETROZOLE 2.5 MG PO TABS
2.5000 mg | ORAL_TABLET | Freq: Every day | ORAL | Status: DC
  Administered 2023-02-27 – 2023-03-06 (×8): 2.5 mg via ORAL
  Filled 2023-02-27 (×8): qty 1

## 2023-02-27 MED ORDER — ALPRAZOLAM 0.5 MG PO TABS
1.5000 mg | ORAL_TABLET | Freq: Every day | ORAL | Status: DC
  Administered 2023-02-27 – 2023-03-01 (×2): 1.5 mg via ORAL
  Filled 2023-02-27: qty 3
  Filled 2023-02-27: qty 6

## 2023-02-27 MED ORDER — OXYCODONE HCL 5 MG PO TABS
10.0000 mg | ORAL_TABLET | ORAL | Status: DC | PRN
  Administered 2023-02-27: 10 mg via ORAL
  Administered 2023-02-28: 15 mg via ORAL
  Filled 2023-02-27: qty 2
  Filled 2023-02-27: qty 3

## 2023-02-27 MED ORDER — ADULT MULTIVITAMIN W/MINERALS CH
1.0000 | ORAL_TABLET | Freq: Every day | ORAL | Status: DC
  Administered 2023-02-27 – 2023-03-06 (×8): 1 via ORAL
  Filled 2023-02-27 (×8): qty 1

## 2023-02-27 MED ORDER — HYDRALAZINE HCL 20 MG/ML IJ SOLN: 10.0000 mg | INTRAMUSCULAR | Status: DC | PRN

## 2023-02-27 MED ORDER — METHOCARBAMOL 500 MG PO TABS
1000.0000 mg | ORAL_TABLET | Freq: Four times a day (QID) | ORAL | Status: DC
  Administered 2023-02-27: 1000 mg via ORAL
  Filled 2023-02-27: qty 2

## 2023-02-27 MED ORDER — THIAMINE MONONITRATE 100 MG PO TABS
100.0000 mg | ORAL_TABLET | Freq: Every day | ORAL | Status: DC
  Administered 2023-02-27 – 2023-03-06 (×8): 100 mg via ORAL
  Filled 2023-02-27 (×8): qty 1

## 2023-02-27 MED ORDER — ONDANSETRON 4 MG PO TBDP: 4.0000 mg | ORAL_TABLET | Freq: Four times a day (QID) | ORAL | Status: DC | PRN

## 2023-02-27 MED ORDER — BUPROPION HCL ER (XL) 150 MG PO TB24
300.0000 mg | ORAL_TABLET | Freq: Every day | ORAL | Status: DC
  Administered 2023-02-27 – 2023-03-06 (×8): 300 mg via ORAL
  Filled 2023-02-27 (×3): qty 2
  Filled 2023-02-27: qty 1
  Filled 2023-02-27 (×4): qty 2

## 2023-02-27 MED ORDER — LORAZEPAM 1 MG PO TABS
1.0000 mg | ORAL_TABLET | ORAL | Status: AC | PRN
  Administered 2023-02-28 – 2023-03-01 (×3): 1 mg via ORAL
  Filled 2023-02-27 (×2): qty 1

## 2023-02-27 MED ORDER — DULOXETINE HCL 60 MG PO CPEP
90.0000 mg | ORAL_CAPSULE | Freq: Every day | ORAL | Status: DC
  Administered 2023-02-27 – 2023-03-06 (×8): 90 mg via ORAL
  Filled 2023-02-27 (×4): qty 1
  Filled 2023-02-27: qty 3
  Filled 2023-02-27 (×3): qty 1

## 2023-02-27 MED ORDER — LACTATED RINGERS IV SOLN: INTRAVENOUS | Status: DC

## 2023-02-27 MED ORDER — BUSPIRONE HCL 5 MG PO TABS
7.5000 mg | ORAL_TABLET | Freq: Two times a day (BID) | ORAL | Status: DC
  Administered 2023-02-27 – 2023-03-06 (×15): 7.5 mg via ORAL
  Filled 2023-02-27 (×5): qty 2
  Filled 2023-02-27: qty 1
  Filled 2023-02-27 (×8): qty 2
  Filled 2023-02-27: qty 1
  Filled 2023-02-27: qty 2

## 2023-02-27 MED ORDER — OXYCODONE HCL 5 MG PO TABS
5.0000 mg | ORAL_TABLET | ORAL | Status: DC | PRN
  Administered 2023-02-27: 10 mg via ORAL
  Filled 2023-02-27: qty 2

## 2023-02-27 MED ORDER — HYDROXYCHLOROQUINE SULFATE 200 MG PO TABS
400.0000 mg | ORAL_TABLET | ORAL | Status: DC
  Administered 2023-02-27 – 2023-03-05 (×6): 400 mg via ORAL
  Filled 2023-02-27 (×7): qty 2

## 2023-02-27 MED ORDER — METHOCARBAMOL 500 MG PO TABS: 1000.0000 mg | ORAL_TABLET | Freq: Three times a day (TID) | ORAL | Status: DC

## 2023-02-27 MED ORDER — GABAPENTIN 300 MG PO CAPS
300.0000 mg | ORAL_CAPSULE | Freq: Three times a day (TID) | ORAL | Status: DC
  Filled 2023-02-27: qty 1

## 2023-02-27 NOTE — Progress Notes (Signed)
Orthopedic Tech Progress Note Patient Details:  Taylor Chandler April 29, 1976 [id number]  Called in order to [facility] for a TLSO VERTALIGN   Patient ID: Taylor Chandler, female   DOB: 09/17/1976, 47 y.o.   MRN: [id number]  Donald Pore 02/27/2023, 8:19 AM

## 2023-02-27 NOTE — ED Notes (Signed)
Patient falling asleep in conversation.  

## 2023-02-27 NOTE — ED Notes (Signed)
Pt's purewick leaked, cleaned pt and changed bed.

## 2023-02-27 NOTE — ED Notes (Signed)
Patient continuing to yell at this staff, stating that we are not helping her. Patient states that she want to transfer to another facility. Charge nurse notified.

## 2023-02-27 NOTE — ED Notes (Signed)
Patient attempting to sit up at this time. Instructed to remain flat.

## 2023-02-27 NOTE — TOC CAGE-AID Note (Signed)
Transition of Care [employer]) - CAGE-AID Screening   Patient Details  Name: Taylor Chandler MRN: [id number] Date of Birth: 1976-08-02  [facility] CM/SW Contact:    Leota Sauers, RN Phone Number: 02/27/2023, 12:23 AM   Clinical Narrative:  Patient endorses use of alcohol denies illicit drugs. TOC consult for SA has been placed.  CAGE-AID Screening:    Have You Ever Felt You Ought to Cut Down on Your Drinking or Drug Use?: No Have People Annoyed You By Critizing Your Drinking Or Drug Use?: No Have You Felt Bad Or Guilty About Your Drinking Or Drug Use?: No Have You Ever Had a Drink or Used Drugs First Thing In The Morning to Steady Your Nerves or to Get Rid of a Hangover?: No CAGE-AID Score: 0  Substance Abuse Education Offered: No

## 2023-02-27 NOTE — ED Notes (Signed)
Spoke with family and patient, provided update about room and notified Trauma MD about family concerns of patients pain not being under control with that was ordered.  Family requested patient be cleaned up due to urine leaking and sent 2 techs and an RN in to help get her cleaned up due to purwick leaking.
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
## 2023-02-27 NOTE — ED Notes (Signed)
MD Lovick at bedside requesting that pt receive the newly ordered pain medication be adminstered STAT as well as a dose of Oxycodone PRN. MD aware that this will be an early administration, however requests that medication be administered anyway. Pt appears somnolent, MD aware
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
## 2023-02-28 ENCOUNTER — Inpatient Hospital Stay (HOSPITAL_COMMUNITY)

## 2023-02-28 ENCOUNTER — Other Ambulatory Visit: Payer: Self-pay

## 2023-02-28 ENCOUNTER — Inpatient Hospital Stay (HOSPITAL_COMMUNITY): Admission: EM | Disposition: A | Discharge status: Home / Self Care | Payer: Self-pay | Source: Home / Self Care

## 2023-02-28 ENCOUNTER — Encounter (HOSPITAL_COMMUNITY): Payer: Self-pay | Admitting: General Surgery

## 2023-02-28 DIAGNOSIS — F419 Anxiety disorder, unspecified: Secondary | ICD-10-CM

## 2023-02-28 DIAGNOSIS — E039 Hypothyroidism, unspecified: Secondary | ICD-10-CM | POA: Diagnosis not present

## 2023-02-28 DIAGNOSIS — M329 Systemic lupus erythematosus, unspecified: Secondary | ICD-10-CM

## 2023-02-28 DIAGNOSIS — S32011A Stable burst fracture of first lumbar vertebra, initial encounter for closed fracture: Secondary | ICD-10-CM

## 2023-02-28 DIAGNOSIS — Z981 Arthrodesis status: Secondary | ICD-10-CM

## 2023-02-28 HISTORY — PX: LAMINECTOMY WITH POSTERIOR LATERAL ARTHRODESIS LEVEL 3: SHX6337

## 2023-02-28 LAB — BASIC METABOLIC PANEL
Anion gap: 10 (ref 5–15)
BUN: 7 mg/dL (ref 6–20)
CO2: 21 mmol/L — ABNORMAL LOW (ref 22–32)
Calcium: 8.4 mg/dL — ABNORMAL LOW (ref 8.9–10.3)
Chloride: 99 mmol/L (ref 98–111)
Creatinine, Ser: 0.63 mg/dL (ref 0.44–1.00)
GFR, Estimated: >60 mL/min (ref >60)
Glucose, Bld: 121 mg/dL — ABNORMAL HIGH (ref 70–99)
Potassium: 3.7 mmol/L (ref 3.5–5.1)
Sodium: 130 mmol/L — ABNORMAL LOW (ref 135–145)

## 2023-02-28 LAB — CBC
HCT: 35.7 % — ABNORMAL LOW (ref 36.0–46.0)
Hemoglobin: 11.8 g/dL — ABNORMAL LOW (ref 12.0–15.0)
MCH: 31.6 pg (ref 26.0–34.0)
MCHC: 33.1 g/dL (ref 30.0–36.0)
MCV: 95.7 fL (ref 80.0–100.0)
Platelets: 204 10*3/uL (ref 150–400)
RBC: 3.73 MIL/uL — ABNORMAL LOW (ref 3.87–5.11)
RDW: 12.8 % (ref 11.5–15.5)
WBC: 7.1 10*3/uL (ref 4.0–10.5)
nRBC: 0 % (ref 0.0–0.2)

## 2023-02-28 LAB — SURGICAL PCR SCREEN
MRSA, PCR: NEGATIVE
Staphylococcus aureus: NEGATIVE

## 2023-02-28 SURGERY — LAMINECTOMY WITH POSTERIOR LATERAL ARTHRODESIS LEVEL 3
Anesthesia: General | Site: Back

## 2023-02-28 MED ORDER — THROMBIN 5000 UNITS EX SOLR
CUTANEOUS | Status: AC
  Filled 2023-02-28: qty 5000

## 2023-02-28 MED ORDER — SODIUM CHLORIDE 0.9% FLUSH: 3.0000 mL | INTRAVENOUS | Status: DC | PRN

## 2023-02-28 MED ORDER — PHENYLEPHRINE 80 MCG/ML (10ML) SYRINGE FOR IV PUSH (FOR BLOOD PRESSURE SUPPORT)
PREFILLED_SYRINGE | INTRAVENOUS | Status: DC | PRN
  Administered 2023-02-28 (×2): 80 ug via INTRAVENOUS

## 2023-02-28 MED ORDER — PHENOL 1.4 % MT LIQD: 1.0000 | OROMUCOSAL | Status: DC | PRN

## 2023-02-28 MED ORDER — SUCCINYLCHOLINE CHLORIDE 200 MG/10ML IV SOSY
PREFILLED_SYRINGE | INTRAVENOUS | Status: DC | PRN
  Administered 2023-02-28: 100 mg via INTRAVENOUS

## 2023-02-28 MED ORDER — SURGIPHOR WOUND IRRIGATION SYSTEM - OPTIME
TOPICAL | Status: DC | PRN
  Administered 2023-02-28: 450 mL

## 2023-02-28 MED ORDER — TRANEXAMIC ACID-NACL 1000-0.7 MG/100ML-% IV SOLN
INTRAVENOUS | Status: AC
  Filled 2023-02-28: qty 100

## 2023-02-28 MED ORDER — PHENYLEPHRINE HCL-NACL 20-0.9 MG/250ML-% IV SOLN
INTRAVENOUS | Status: DC | PRN
  Administered 2023-02-28: 40 ug/min via INTRAVENOUS

## 2023-02-28 MED ORDER — OXYCODONE HCL 5 MG PO TABS
10.0000 mg | ORAL_TABLET | ORAL | Status: DC | PRN
  Administered 2023-02-28 – 2023-03-01 (×4): 10 mg via ORAL
  Filled 2023-02-28 (×4): qty 2

## 2023-02-28 MED ORDER — MENTHOL 3 MG MT LOZG: 1.0000 | LOZENGE | OROMUCOSAL | Status: DC | PRN

## 2023-02-28 MED ORDER — HYDROMORPHONE HCL 1 MG/ML IJ SOLN
1.0000 mg | INTRAMUSCULAR | Status: DC | PRN
  Administered 2023-02-28 – 2023-03-01 (×3): 1 mg via INTRAVENOUS
  Filled 2023-02-28 (×3): qty 1

## 2023-02-28 MED ORDER — THROMBIN 20000 UNITS EX SOLR
CUTANEOUS | Status: DC | PRN
  Administered 2023-02-28: 20 mL via TOPICAL

## 2023-02-28 MED ORDER — PROPOFOL 10 MG/ML IV BOLUS
INTRAVENOUS | Status: DC | PRN
  Administered 2023-02-28: 50 mg via INTRAVENOUS

## 2023-02-28 MED ORDER — EPHEDRINE 5 MG/ML INJ
INTRAVENOUS | Status: AC
  Filled 2023-02-28: qty 5

## 2023-02-28 MED ORDER — CHLORHEXIDINE GLUCONATE 0.12 % MT SOLN: 15.0000 mL | Freq: Once | OROMUCOSAL | Status: DC

## 2023-02-28 MED ORDER — SUGAMMADEX SODIUM 200 MG/2ML IV SOLN
INTRAVENOUS | Status: DC | PRN
  Administered 2023-02-28: 100 mg via INTRAVENOUS
  Administered 2023-02-28: 200 mg via INTRAVENOUS

## 2023-02-28 MED ORDER — ORAL CARE MOUTH RINSE: 15.0000 mL | Freq: Once | OROMUCOSAL | Status: DC

## 2023-02-28 MED ORDER — MIDAZOLAM HCL 2 MG/2ML IJ SOLN
INTRAMUSCULAR | Status: AC
  Filled 2023-02-28: qty 2

## 2023-02-28 MED ORDER — ACETAMINOPHEN 500 MG PO TABS: 1000.0000 mg | ORAL_TABLET | Freq: Once | ORAL | Status: DC

## 2023-02-28 MED ORDER — LIDOCAINE 2% (20 MG/ML) 5 ML SYRINGE
INTRAMUSCULAR | Status: DC | PRN
  Administered 2023-02-28: 40 mg via INTRAVENOUS

## 2023-02-28 MED ORDER — 0.9 % SODIUM CHLORIDE (POUR BTL) OPTIME
TOPICAL | Status: DC | PRN
  Administered 2023-02-28: 1000 mL

## 2023-02-28 MED ORDER — CEFAZOLIN SODIUM-DEXTROSE 2-4 GM/100ML-% IV SOLN
INTRAVENOUS | Status: AC
  Administered 2023-02-28: 2000 mg
  Filled 2023-02-28: qty 100

## 2023-02-28 MED ORDER — METHOCARBAMOL 500 MG PO TABS
500.0000 mg | ORAL_TABLET | Freq: Four times a day (QID) | ORAL | Status: DC | PRN
  Administered 2023-02-28 – 2023-03-01 (×2): 500 mg via ORAL
  Filled 2023-02-28 (×2): qty 1

## 2023-02-28 MED ORDER — ROCURONIUM BROMIDE 10 MG/ML (PF) SYRINGE
PREFILLED_SYRINGE | INTRAVENOUS | Status: DC | PRN
  Administered 2023-02-28: 60 mg via INTRAVENOUS
  Administered 2023-02-28 (×4): 10 mg via INTRAVENOUS

## 2023-02-28 MED ORDER — HYDROMORPHONE HCL 1 MG/ML IJ SOLN
INTRAMUSCULAR | Status: AC
  Administered 2023-02-28: 0.5 mg via INTRAVENOUS
  Filled 2023-02-28: qty 1

## 2023-02-28 MED ORDER — ONDANSETRON HCL 4 MG PO TABS: 4.0000 mg | ORAL_TABLET | Freq: Four times a day (QID) | ORAL | Status: DC | PRN

## 2023-02-28 MED ORDER — THROMBIN 20000 UNITS EX SOLR
CUTANEOUS | Status: AC
  Filled 2023-02-28: qty 20000

## 2023-02-28 MED ORDER — THROMBIN 5000 UNITS EX SOLR
OROMUCOSAL | Status: DC | PRN
  Administered 2023-02-28 (×2): 5 mL via TOPICAL

## 2023-02-28 MED ORDER — HYDROMORPHONE HCL 1 MG/ML IJ SOLN
0.2500 mg | INTRAMUSCULAR | Status: DC | PRN
  Administered 2023-02-28: 0.5 mg via INTRAVENOUS

## 2023-02-28 MED ORDER — BUPIVACAINE HCL (PF) 0.25 % IJ SOLN
INTRAMUSCULAR | Status: DC | PRN
  Administered 2023-02-28: 10 mL

## 2023-02-28 MED ORDER — CEFAZOLIN SODIUM-DEXTROSE 2-4 GM/100ML-% IV SOLN
2.0000 g | Freq: Three times a day (TID) | INTRAVENOUS | Status: AC
  Administered 2023-03-01: 2 g via INTRAVENOUS
  Filled 2023-02-28 (×2): qty 100

## 2023-02-28 MED ORDER — SODIUM CHLORIDE 0.9 % IV SOLN
250.0000 mL | INTRAVENOUS | Status: DC
  Administered 2023-02-28: 250 mL via INTRAVENOUS

## 2023-02-28 MED ORDER — FENTANYL CITRATE (PF) 250 MCG/5ML IJ SOLN
INTRAMUSCULAR | Status: DC | PRN
  Administered 2023-02-28 (×3): 50 ug via INTRAVENOUS

## 2023-02-28 MED ORDER — METHOCARBAMOL 1000 MG/10ML IJ SOLN: 500.0000 mg | Freq: Four times a day (QID) | INTRAVENOUS | Status: DC | PRN

## 2023-02-28 MED ORDER — LIDOCAINE 2% (20 MG/ML) 5 ML SYRINGE
INTRAMUSCULAR | Status: AC
  Filled 2023-02-28: qty 5

## 2023-02-28 MED ORDER — NALOXONE HCL 0.4 MG/ML IJ SOLN: 0.4000 mg | Freq: Once | INTRAMUSCULAR | Status: DC | PRN

## 2023-02-28 MED ORDER — BUPIVACAINE HCL (PF) 0.25 % IJ SOLN
INTRAMUSCULAR | Status: AC
  Filled 2023-02-28: qty 30

## 2023-02-28 MED ORDER — ONDANSETRON HCL 4 MG/2ML IJ SOLN
INTRAMUSCULAR | Status: DC | PRN
  Administered 2023-02-28: 4 mg via INTRAVENOUS

## 2023-02-28 MED ORDER — SENNA 8.6 MG PO TABS
1.0000 | ORAL_TABLET | Freq: Two times a day (BID) | ORAL | Status: DC
  Administered 2023-02-28 – 2023-03-01 (×2): 8.6 mg via ORAL
  Filled 2023-02-28 (×2): qty 1

## 2023-02-28 MED ORDER — DEXAMETHASONE SODIUM PHOSPHATE 10 MG/ML IJ SOLN
INTRAMUSCULAR | Status: AC
  Filled 2023-02-28: qty 1

## 2023-02-28 MED ORDER — ONDANSETRON HCL 4 MG/2ML IJ SOLN
INTRAMUSCULAR | Status: AC
  Filled 2023-02-28: qty 2

## 2023-02-28 MED ORDER — CELECOXIB 200 MG PO CAPS
200.0000 mg | ORAL_CAPSULE | Freq: Two times a day (BID) | ORAL | Status: DC
  Administered 2023-02-28 – 2023-03-06 (×12): 200 mg via ORAL
  Filled 2023-02-28 (×12): qty 1

## 2023-02-28 MED ORDER — POTASSIUM CHLORIDE IN NACL 20-0.9 MEQ/L-% IV SOLN
INTRAVENOUS | Status: DC
  Filled 2023-02-28: qty 1000

## 2023-02-28 MED ORDER — SODIUM CHLORIDE 0.9% FLUSH
3.0000 mL | Freq: Two times a day (BID) | INTRAVENOUS | Status: DC
  Administered 2023-02-28 – 2023-03-06 (×12): 3 mL via INTRAVENOUS

## 2023-02-28 MED ORDER — LACTATED RINGERS IV SOLN: INTRAVENOUS | Status: DC

## 2023-02-28 MED ORDER — CEFAZOLIN SODIUM-DEXTROSE 1-4 GM/50ML-% IV SOLN
INTRAVENOUS | Status: DC | PRN
  Administered 2023-02-28: 2 g via INTRAVENOUS

## 2023-02-28 MED ORDER — CEFAZOLIN SODIUM-DEXTROSE 2-4 GM/100ML-% IV SOLN
INTRAVENOUS | Status: AC
  Filled 2023-02-28: qty 100

## 2023-02-28 MED ORDER — CHLORHEXIDINE GLUCONATE 0.12 % MT SOLN
OROMUCOSAL | Status: AC
  Filled 2023-02-28: qty 15

## 2023-02-28 MED ORDER — KETAMINE HCL 50 MG/5ML IJ SOSY
PREFILLED_SYRINGE | INTRAMUSCULAR | Status: AC
  Filled 2023-02-28: qty 5

## 2023-02-28 MED ORDER — VANCOMYCIN HCL 1000 MG IV SOLR
INTRAVENOUS | Status: AC
  Filled 2023-02-28: qty 20

## 2023-02-28 MED ORDER — PROPOFOL 10 MG/ML IV BOLUS
INTRAVENOUS | Status: AC
  Filled 2023-02-28: qty 20

## 2023-02-28 MED ORDER — FENTANYL CITRATE (PF) 250 MCG/5ML IJ SOLN
INTRAMUSCULAR | Status: AC
  Filled 2023-02-28: qty 5

## 2023-02-28 MED ORDER — ONDANSETRON HCL 4 MG/2ML IJ SOLN: 4.0000 mg | Freq: Four times a day (QID) | INTRAMUSCULAR | Status: DC | PRN

## 2023-02-28 MED ORDER — DEXAMETHASONE SODIUM PHOSPHATE 10 MG/ML IJ SOLN
INTRAMUSCULAR | Status: DC | PRN
  Administered 2023-02-28: 10 mg via INTRAVENOUS

## 2023-02-28 MED ORDER — ACETAMINOPHEN 500 MG PO TABS
1000.0000 mg | ORAL_TABLET | Freq: Four times a day (QID) | ORAL | Status: AC
  Administered 2023-02-28 – 2023-03-01 (×4): 1000 mg via ORAL
  Filled 2023-02-28 (×4): qty 2

## 2023-02-28 MED ORDER — ALBUMIN HUMAN 5 % IV SOLN: INTRAVENOUS | Status: DC | PRN

## 2023-02-28 SURGICAL SUPPLY — 70 items
ADH SKN CLS APL DERMABOND .7 (GAUZE/BANDAGES/DRESSINGS) ×1
APL SKNCLS STERI-STRIP NONHPOA (GAUZE/BANDAGES/DRESSINGS) ×1
BAG COUNTER SPONGE SURGICOUNT (BAG) ×1 IMPLANT
BAG SPNG CNTER NS LX DISP (BAG) ×1
BASKET BONE COLLECTION (BASKET) IMPLANT
BENZOIN TINCTURE PRP APPL 2/3 (GAUZE/BANDAGES/DRESSINGS) ×1 IMPLANT
BLADE BONE MILL MEDIUM (MISCELLANEOUS) IMPLANT
BLADE CLIPPER SURG (BLADE) IMPLANT
BONE FIBERS PLIAFX 10 (Bone Implant) ×1 IMPLANT
BUR CARBIDE MATCH 3.0 (BURR) ×1 IMPLANT
CANISTER SUCT 3000ML PPV (MISCELLANEOUS) ×1 IMPLANT
CNTNR URN SCR LID CUP LEK RST (MISCELLANEOUS) ×1 IMPLANT
CONT SPEC 4OZ STRL OR WHT (MISCELLANEOUS) ×1
COVER BACK TABLE 60X90IN (DRAPES) ×1 IMPLANT
DERMABOND ADVANCED .7 DNX12 (GAUZE/BANDAGES/DRESSINGS) IMPLANT
DRAPE C-ARM 42X72 X-RAY (DRAPES) IMPLANT
DRAPE C-ARMOR (DRAPES) IMPLANT
DRAPE LAPAROTOMY 100X72X124 (DRAPES) ×1 IMPLANT
DRAPE SURG 17X23 STRL (DRAPES) ×1 IMPLANT
DRSG OPSITE POSTOP 4X10 (GAUZE/BANDAGES/DRESSINGS) IMPLANT
DRSG OPSITE POSTOP 4X8 (GAUZE/BANDAGES/DRESSINGS) IMPLANT
DURAPREP 26ML APPLICATOR (WOUND CARE) ×1 IMPLANT
ELECT REM PT RETURN 9FT ADLT (ELECTROSURGICAL) ×1
ELECTRODE REM PT RTRN 9FT ADLT (ELECTROSURGICAL) ×1 IMPLANT
EVACUATOR 1/8 PVC DRAIN (DRAIN) IMPLANT
FIBER BONE ALLOSYNC EXPAND 10 (Bone Implant) IMPLANT
GAUZE 4X4 16PLY ~~LOC~~+RFID DBL (SPONGE) IMPLANT
GLOVE BIO SURGEON STRL SZ7 (GLOVE) IMPLANT
GLOVE BIO SURGEON STRL SZ8 (GLOVE) ×2 IMPLANT
GLOVE BIOGEL PI IND STRL 7.0 (GLOVE) IMPLANT
GOWN STRL REUS W/ TWL LRG LVL3 (GOWN DISPOSABLE) IMPLANT
GOWN STRL REUS W/ TWL XL LVL3 (GOWN DISPOSABLE) ×2 IMPLANT
GOWN STRL REUS W/TWL 2XL LVL3 (GOWN DISPOSABLE) IMPLANT
GOWN STRL REUS W/TWL LRG LVL3 (GOWN DISPOSABLE)
GOWN STRL REUS W/TWL XL LVL3 (GOWN DISPOSABLE) ×2
GRAFT BNE FBR PLIAFX PRIME 10 (Bone Implant) IMPLANT
GRAFT BONE PROTEIOS LRG 5CC (Orthopedic Implant) IMPLANT
HEMOSTAT POWDER KIT SURGIFOAM (HEMOSTASIS) IMPLANT
KIT BASIN OR (CUSTOM PROCEDURE TRAY) ×1 IMPLANT
KIT TURNOVER KIT B (KITS) ×1 IMPLANT
MILL BONE PREP (MISCELLANEOUS) IMPLANT
NDL HYPO 25X1 1.5 SAFETY (NEEDLE) ×1 IMPLANT
NEEDLE HYPO 25X1 1.5 SAFETY (NEEDLE) ×1 IMPLANT
NS IRRIG 1000ML POUR BTL (IV SOLUTION) ×1 IMPLANT
PACK LAMINECTOMY NEURO (CUSTOM PROCEDURE TRAY) ×1 IMPLANT
PAD ARMBOARD 7.5X6 YLW CONV (MISCELLANEOUS) ×3 IMPLANT
PUTTY DBM 10CC (Putty) IMPLANT
ROD KODIAK 5.5X300 (Rod) IMPLANT
SCREW CANC PA 5.5X40 (Screw) IMPLANT
SCREW CORT-CANC 5.5X45 (Screw) IMPLANT
SCREW KODIAK 5.5X45 (Screw) IMPLANT
SCREW KODIAK 5.5X50 (Screw) IMPLANT
SCREW PA 5X40 (Screw) IMPLANT
SCREW PA 5X45 (Screw) IMPLANT
SET SCREW (Screw) ×10 IMPLANT
SET SCREW SPNE (Screw) IMPLANT
SOL ELECTROSURG ANTI STICK (MISCELLANEOUS)
SOLUTION ELECTROSURG ANTI STCK (MISCELLANEOUS) ×1 IMPLANT
SOLUTION IRRIG SURGIPHOR (IV SOLUTION) IMPLANT
SPONGE SURGIFOAM ABS GEL 100 (HEMOSTASIS) ×1 IMPLANT
SPONGE T-LAP 4X18 ~~LOC~~+RFID (SPONGE) IMPLANT
STRIP CLOSURE SKIN 1/2X4 (GAUZE/BANDAGES/DRESSINGS) ×2 IMPLANT
SUT VIC AB 0 CT1 18XCR BRD8 (SUTURE) ×1 IMPLANT
SUT VIC AB 0 CT1 8-18 (SUTURE) ×1
SUT VIC AB 2-0 CP2 18 (SUTURE) ×1 IMPLANT
SUT VIC AB 3-0 SH 8-18 (SUTURE) ×2 IMPLANT
TOWEL GREEN STERILE (TOWEL DISPOSABLE) ×1 IMPLANT
TOWEL GREEN STERILE FF (TOWEL DISPOSABLE) ×1 IMPLANT
TRAY FOLEY MTR SLVR 16FR STAT (SET/KITS/TRAYS/PACK) IMPLANT
WATER STERILE IRR 1000ML POUR (IV SOLUTION) ×1 IMPLANT

## 2023-02-28 NOTE — Anesthesia Preprocedure Evaluation (Addendum)
Anesthesia Evaluation  Patient identified by MRN, date of birth, ID band Patient awake    Reviewed: Allergy & Precautions, NPO status , Patient's Chart, lab work & pertinent test results  Airway Mallampati: III  TM Distance: >3 FB Neck ROM: Full    Dental  (+) Teeth Intact, Dental Advisory Given   Pulmonary neg pulmonary ROS   Pulmonary exam normal breath sounds clear to auscultation       Cardiovascular negative cardio ROS Normal cardiovascular exam Rhythm:Regular Rate:Normal     Neuro/Psych  PSYCHIATRIC DISORDERS Anxiety      C-spine not cleared    GI/Hepatic negative GI ROS, Neg liver ROS,,,  Endo/Other  Hypothyroidism  SLE  Renal/GU negative Renal ROS  negative genitourinary   Musculoskeletal negative musculoskeletal ROS (+)    Abdominal   Peds  Hematology negative hematology ROS (+)   Anesthesia Other Findings   Reproductive/Obstetrics                             Anesthesia Physical Anesthesia Plan  ASA: 2  Anesthesia Plan: General   Post-op Pain Management: Tylenol PO (pre-op)*   Induction: Intravenous  PONV Risk Score and Plan: 3 and Midazolam, Dexamethasone and Ondansetron  Airway Management Planned: Oral ETT and Video Laryngoscope Planned  Additional Equipment:   Intra-op Plan:   Post-operative Plan: Extubation in OR  Informed Consent: I have reviewed the patients History and Physical, chart, labs and discussed the procedure including the risks, benefits and alternatives for the proposed anesthesia with the patient or authorized representative who has indicated his/her understanding and acceptance.     Dental advisory given  Plan Discussed with: CRNA  Anesthesia Plan Comments:        Anesthesia Quick Evaluation
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
## 2023-02-28 NOTE — Anesthesia Procedure Notes (Cosign Needed)
Procedure Name: Intubation Date/Time: 02/28/2023 2:42 PM  Performed by: Woodrum, Chelsey L, MDPre-anesthesia Checklist: Patient identified, Emergency Drugs available, Suction available and Patient being monitored Patient Re-evaluated:Patient Re-evaluated prior to induction Oxygen Delivery Method: Circle system utilized Preoxygenation: Pre-oxygenation with 100% oxygen Induction Type: Rapid sequence Laryngoscope Size: Glidescope and 3 Grade View: Grade I Tube type: Oral Tube size: 7.5 mm Number of attempts: 1 Airway Equipment and Method: Video-laryngoscopy Placement Confirmation: ETT inserted through vocal cords under direct vision, positive ETCO2 and breath sounds checked- equal and bilateral Secured at: 23 cm Tube secured with: Tape Dental Injury: Teeth and Oropharynx as per pre-operative assessment

## 2023-02-28 NOTE — Transfer of Care (Signed)
Immediate Anesthesia Transfer of Care Note  Patient: Taylor Chandler  Procedure(s) Performed: Open Reduction Internal Fixation Lumbar one Fracture, Pedicle Screws Thoracic eleven-Lumbar three (Back)  Patient Location: PACU  Anesthesia Type:General  Level of Consciousness: awake, alert , and oriented  Airway & Oxygen Therapy: Patient Spontanous Breathing and Patient connected to face mask oxygen  Post-op Assessment: Report given to RN and Post -op Vital signs reviewed and stable  Post vital signs: Reviewed and stable  Last Vitals:  Vitals Value Taken Time  BP 120/63 02/28/23 1752  Temp 36.3 C 02/28/23 1752  Pulse 112 02/28/23 1754  Resp 16 02/28/23 1757  SpO2 93 % 02/28/23 1754  Vitals shown include unvalidated device data.  Last Pain:  Vitals:   02/28/23 1140  TempSrc: Oral  PainSc:          Complications: There were no known notable events for this encounter.
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
## 2023-03-01 LAB — BASIC METABOLIC PANEL
Anion gap: 13 (ref 5–15)
BUN: 5 mg/dL — ABNORMAL LOW (ref 6–20)
CO2: 21 mmol/L — ABNORMAL LOW (ref 22–32)
Calcium: 8.4 mg/dL — ABNORMAL LOW (ref 8.9–10.3)
Chloride: 101 mmol/L (ref 98–111)
Creatinine, Ser: 0.53 mg/dL (ref 0.44–1.00)
GFR, Estimated: >60 mL/min (ref >60)
Glucose, Bld: 116 mg/dL — ABNORMAL HIGH (ref 70–99)
Potassium: 4.1 mmol/L (ref 3.5–5.1)
Sodium: 135 mmol/L (ref 135–145)

## 2023-03-01 LAB — CBC
HCT: 31.6 % — ABNORMAL LOW (ref 36.0–46.0)
Hemoglobin: 10.2 g/dL — ABNORMAL LOW (ref 12.0–15.0)
MCH: 31.2 pg (ref 26.0–34.0)
MCHC: 32.3 g/dL (ref 30.0–36.0)
MCV: 96.6 fL (ref 80.0–100.0)
Platelets: 174 10*3/uL (ref 150–400)
RBC: 3.27 MIL/uL — ABNORMAL LOW (ref 3.87–5.11)
RDW: 12.6 % (ref 11.5–15.5)
WBC: 7.6 10*3/uL (ref 4.0–10.5)
nRBC: 0 % (ref 0.0–0.2)

## 2023-03-01 MED ORDER — ALPRAZOLAM 0.5 MG PO TABS
1.5000 mg | ORAL_TABLET | Freq: Every evening | ORAL | Status: DC | PRN
  Administered 2023-03-01 – 2023-03-05 (×5): 1.5 mg via ORAL
  Filled 2023-03-01 (×5): qty 3

## 2023-03-01 MED ORDER — SENNOSIDES-DOCUSATE SODIUM 8.6-50 MG PO TABS
1.0000 | ORAL_TABLET | Freq: Two times a day (BID) | ORAL | Status: DC
  Administered 2023-03-01 – 2023-03-06 (×8): 1 via ORAL
  Filled 2023-03-01 (×8): qty 1

## 2023-03-01 MED ORDER — HYDROMORPHONE HCL 1 MG/ML IJ SOLN
0.5000 mg | INTRAMUSCULAR | Status: DC | PRN
  Administered 2023-03-01 – 2023-03-03 (×9): 1 mg via INTRAVENOUS
  Filled 2023-03-01 (×10): qty 1

## 2023-03-01 MED ORDER — POLYETHYLENE GLYCOL 3350 17 G PO PACK
17.0000 g | PACK | Freq: Two times a day (BID) | ORAL | Status: DC
  Administered 2023-03-01 – 2023-03-06 (×7): 17 g via ORAL
  Filled 2023-03-01 (×7): qty 1

## 2023-03-01 MED ORDER — CHLORHEXIDINE GLUCONATE CLOTH 2 % EX PADS
6.0000 | MEDICATED_PAD | Freq: Every day | CUTANEOUS | Status: DC
  Administered 2023-03-02: 6 via TOPICAL

## 2023-03-01 MED ORDER — OXYCODONE HCL 5 MG PO TABS
10.0000 mg | ORAL_TABLET | ORAL | Status: DC | PRN
  Administered 2023-03-01 – 2023-03-02 (×3): 10 mg via ORAL
  Administered 2023-03-02: 15 mg via ORAL
  Administered 2023-03-02: 10 mg via ORAL
  Administered 2023-03-02 – 2023-03-03 (×7): 15 mg via ORAL
  Administered 2023-03-04 (×2): 10 mg via ORAL
  Administered 2023-03-04 (×2): 15 mg via ORAL
  Administered 2023-03-04 – 2023-03-05 (×2): 10 mg via ORAL
  Filled 2023-03-01: qty 3
  Filled 2023-03-01: qty 2
  Filled 2023-03-01: qty 3
  Filled 2023-03-01: qty 2
  Filled 2023-03-01 (×4): qty 3
  Filled 2023-03-01: qty 2
  Filled 2023-03-01: qty 3
  Filled 2023-03-01: qty 2
  Filled 2023-03-01: qty 3
  Filled 2023-03-01 (×3): qty 2
  Filled 2023-03-01: qty 3
  Filled 2023-03-01: qty 2
  Filled 2023-03-01: qty 3

## 2023-03-01 MED ORDER — METHOCARBAMOL 500 MG PO TABS
1000.0000 mg | ORAL_TABLET | Freq: Three times a day (TID) | ORAL | Status: DC
  Administered 2023-03-01 – 2023-03-02 (×4): 1000 mg via ORAL
  Filled 2023-03-01 (×4): qty 2

## 2023-03-01 MED ORDER — ORAL CARE MOUTH RINSE: 15.0000 mL | OROMUCOSAL | Status: DC | PRN

## 2023-03-01 MED ORDER — POLYETHYLENE GLYCOL 3350 17 G PO PACK
17.0000 g | PACK | Freq: Every day | ORAL | Status: DC
  Administered 2023-03-01: 17 g via ORAL
  Filled 2023-03-01: qty 1

## 2023-03-01 MED ORDER — LEVOTHYROXINE SODIUM 50 MCG PO TABS
50.0000 ug | ORAL_TABLET | Freq: Every day | ORAL | Status: DC
  Administered 2023-03-01 – 2023-03-06 (×6): 50 ug via ORAL
  Filled 2023-03-01 (×6): qty 1

## 2023-03-01 MED FILL — Thrombin For Soln 5000 Unit: CUTANEOUS | Qty: 5000 | Status: AC
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
## 2023-03-01 NOTE — Progress Notes (Signed)
OT Note Seen for eval. Full note to follow. Patient will benefit from intensive inpatient follow up therapy, >3 hours/day  Luisa Dago, OT/L   Acute OT Clinical Specialist Acute Rehabilitation Services Pager [phone] Office [phone]

## 2023-03-01 NOTE — Anesthesia Postprocedure Evaluation (Signed)
Anesthesia Post Note  Patient: Taylor Chandler  Procedure(s) Performed: Open Reduction Internal Fixation Lumbar one Fracture, Pedicle Screws Thoracic eleven-Lumbar three (Back)     Patient location during evaluation: PACU Anesthesia Type: General Level of consciousness: awake and alert Pain management: pain level controlled Vital Signs Assessment: post-procedure vital signs reviewed and stable Respiratory status: spontaneous breathing, nonlabored ventilation, respiratory function stable and patient connected to nasal cannula oxygen Cardiovascular status: blood pressure returned to baseline and stable Postop Assessment: no apparent nausea or vomiting Anesthetic complications: no  There were no known notable events for this encounter.  Last Vitals:  Vitals:   02/28/23 2334 03/01/23 0300  BP: 119/82   Pulse: 100   Resp: 14   Temp: (!) 36.3 C 36.4 C  SpO2: 96%     Last Pain:  Vitals:   03/01/23 0610  TempSrc:   PainSc: 8    Pain Goal:                   Aminta Sakurai L Dartha Rozzell
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
## 2023-03-01 NOTE — Progress Notes (Signed)
? ?  Inpatient Rehab Admissions Coordinator : ? ?Per therapy recommendations, patient was screened for CIR candidacy by Zsofia Prout RN MSN.  At this time patient appears to be a potential candidate for CIR. I will place a rehab consult per protocol for full assessment. Please call me with any questions. ? ?Aleicia Kenagy RN MSN ?Admissions Coordinator ?[phone] ?  ?
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
## 2023-03-02 LAB — BASIC METABOLIC PANEL
Anion gap: 11 (ref 5–15)
BUN: 6 mg/dL (ref 6–20)
CO2: 22 mmol/L (ref 22–32)
Calcium: 8.5 mg/dL — ABNORMAL LOW (ref 8.9–10.3)
Chloride: 100 mmol/L (ref 98–111)
Creatinine, Ser: 0.64 mg/dL (ref 0.44–1.00)
GFR, Estimated: >60 mL/min (ref >60)
Glucose, Bld: 103 mg/dL — ABNORMAL HIGH (ref 70–99)
Potassium: 3.5 mmol/L (ref 3.5–5.1)
Sodium: 133 mmol/L — ABNORMAL LOW (ref 135–145)

## 2023-03-02 LAB — CBC
HCT: 29.4 % — ABNORMAL LOW (ref 36.0–46.0)
Hemoglobin: 9.7 g/dL — ABNORMAL LOW (ref 12.0–15.0)
MCH: 32.8 pg (ref 26.0–34.0)
MCHC: 33 g/dL (ref 30.0–36.0)
MCV: 99.3 fL (ref 80.0–100.0)
Platelets: 155 10*3/uL (ref 150–400)
RBC: 2.96 MIL/uL — ABNORMAL LOW (ref 3.87–5.11)
RDW: 13 % (ref 11.5–15.5)
WBC: 5.5 10*3/uL (ref 4.0–10.5)
nRBC: 0 % (ref 0.0–0.2)

## 2023-03-02 MED ORDER — METHOCARBAMOL 500 MG PO TABS
1000.0000 mg | ORAL_TABLET | Freq: Four times a day (QID) | ORAL | Status: DC
  Administered 2023-03-02 – 2023-03-06 (×15): 1000 mg via ORAL
  Filled 2023-03-02 (×15): qty 2

## 2023-03-02 MED ORDER — MAGNESIUM HYDROXIDE 400 MG/5ML PO SUSP
30.0000 mL | Freq: Once | ORAL | Status: AC
  Administered 2023-03-02: 30 mL via ORAL
  Filled 2023-03-02: qty 30

## 2023-03-02 MED ORDER — TRAMADOL HCL 50 MG PO TABS
100.0000 mg | ORAL_TABLET | Freq: Four times a day (QID) | ORAL | Status: DC
  Administered 2023-03-02 – 2023-03-03 (×3): 100 mg via ORAL
  Filled 2023-03-02 (×3): qty 2

## 2023-03-02 MED ORDER — ACETAMINOPHEN 500 MG PO TABS
1000.0000 mg | ORAL_TABLET | Freq: Four times a day (QID) | ORAL | Status: DC
  Administered 2023-03-02 – 2023-03-06 (×17): 1000 mg via ORAL
  Filled 2023-03-02 (×18): qty 2
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
## 2023-03-02 NOTE — Plan of Care (Signed)
  Problem: Clinical Measurements: Goal: Will remain free from infection Outcome: Progressing   Problem: Education: Goal: Ability to verbalize activity precautions or restrictions will improve Outcome: Progressing Goal: Knowledge of the prescribed therapeutic regimen will improve Outcome: Progressing   Problem: Activity: Goal: Ability to avoid complications of mobility impairment will improve Outcome: Progressing Goal: Ability to tolerate increased activity will improve Outcome: Progressing Goal: Will remain free from falls Outcome: Progressing   Problem: Bowel/Gastric: Goal: Gastrointestinal status for postoperative course will improve Outcome: Progressing   Problem: Clinical Measurements: Goal: Ability to maintain clinical measurements within normal limits will improve Outcome: Progressing Goal: Postoperative complications will be avoided or minimized Outcome: Progressing Goal: Diagnostic test results will improve Outcome: Progressing   Problem: Skin Integrity: Goal: Will show signs of wound healing Outcome: Progressing   Problem: Bladder/Genitourinary: Goal: Urinary functional status for postoperative course will improve Outcome: Progressing   Problem: Pain Management: Goal: Pain level will decrease Outcome: Not Progressing
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
## 2023-03-02 NOTE — Plan of Care (Signed)
  Problem: Clinical Measurements: Goal: Will remain free from infection Outcome: Progressing   Problem: Education: Goal: Ability to verbalize activity precautions or restrictions will improve Outcome: Progressing Goal: Knowledge of the prescribed therapeutic regimen will improve Outcome: Progressing Goal: Understanding of discharge needs will improve Outcome: Progressing

## 2023-03-02 NOTE — Progress Notes (Signed)
Inpatient Rehab Admissions:  Inpatient Rehab Consult received.  I met with patient and husband Robby at the bedside for rehabilitation assessment and to discuss goals and expectations of an inpatient rehab admission.  Discussed average length of stay, insurance authorization requirement, discharge home after completion of CIR. Both acknowledged understanding. Pt interested in pursuing CIR and pt's husband supportive. Robby confirmed that he will be able to provide 24/7 support for pt after discharge. Will continue to follow.  Signed: Wolfgang Phoenix, MS, CCC-SLP Admissions Coordinator [phone]

## 2023-03-03 LAB — CBC
HCT: 26.9 % — ABNORMAL LOW (ref 36.0–46.0)
Hemoglobin: 8.8 g/dL — ABNORMAL LOW (ref 12.0–15.0)
MCH: 31.4 pg (ref 26.0–34.0)
MCHC: 32.7 g/dL (ref 30.0–36.0)
MCV: 96.1 fL (ref 80.0–100.0)
Platelets: 156 10*3/uL (ref 150–400)
RBC: 2.8 MIL/uL — ABNORMAL LOW (ref 3.87–5.11)
RDW: 13.2 % (ref 11.5–15.5)
WBC: 5.5 10*3/uL (ref 4.0–10.5)
nRBC: 0 % (ref 0.0–0.2)

## 2023-03-03 MED ORDER — MAGNESIUM CITRATE PO SOLN
0.5000 | Freq: Once | ORAL | Status: AC
  Administered 2023-03-03: 0.5 via ORAL
  Filled 2023-03-03: qty 296

## 2023-03-03 MED ORDER — HYDROMORPHONE HCL 1 MG/ML IJ SOLN
0.5000 mg | INTRAMUSCULAR | Status: DC | PRN
  Administered 2023-03-03 – 2023-03-04 (×6): 1 mg via INTRAVENOUS
  Filled 2023-03-03 (×6): qty 1

## 2023-03-03 MED ORDER — BOOST / RESOURCE BREEZE PO LIQD CUSTOM
1.0000 | Freq: Two times a day (BID) | ORAL | Status: DC
  Administered 2023-03-03 – 2023-03-05 (×4): 1 via ORAL

## 2023-03-03 MED ORDER — PREGABALIN 25 MG PO CAPS
50.0000 mg | ORAL_CAPSULE | Freq: Three times a day (TID) | ORAL | Status: DC
  Administered 2023-03-03 – 2023-03-06 (×10): 50 mg via ORAL
  Filled 2023-03-03 (×10): qty 2

## 2023-03-03 MED ORDER — KETOROLAC TROMETHAMINE 15 MG/ML IJ SOLN
15.0000 mg | Freq: Three times a day (TID) | INTRAMUSCULAR | Status: DC | PRN
  Administered 2023-03-03 – 2023-03-04 (×2): 15 mg via INTRAVENOUS
  Filled 2023-03-03 (×2): qty 1

## 2023-03-03 NOTE — Progress Notes (Signed)
Overall progressing reasonably well.  Beginning to mobilize slowly.  Febrile.  Vital signs are stable.  Motor and sensory function intact.  Status post thoracic lumbar fusion.  Continue efforts at mobilization.  Working towards inpatient rehab.
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
## 2023-03-03 NOTE — Plan of Care (Signed)
Patient continues to need IV hydromorphone for breakthrough pain.  Ambulating to bathroom independently or with minimal assistance.   Problem: Clinical Measurements: Goal: Will remain free from infection Outcome: Progressing   Problem: Education: Goal: Ability to verbalize activity precautions or restrictions will improve Outcome: Progressing Goal: Knowledge of the prescribed therapeutic regimen will improve Outcome: Progressing Goal: Understanding of discharge needs will improve Outcome: Progressing   Problem: Activity: Goal: Ability to avoid complications of mobility impairment will improve Outcome: Progressing Goal: Will remain free from falls Outcome: Progressing   Problem: Bowel/Gastric: Goal: Gastrointestinal status for postoperative course will improve Outcome: Progressing   Problem: Clinical Measurements: Goal: Ability to maintain clinical measurements within normal limits will improve Outcome: Progressing Goal: Postoperative complications will be avoided or minimized Outcome: Progressing Goal: Diagnostic test results will improve Outcome: Progressing   Problem: Skin Integrity: Goal: Will show signs of wound healing Outcome: Progressing   Problem: Bladder/Genitourinary: Goal: Urinary functional status for postoperative course will improve Outcome: Progressing   Problem: Activity: Goal: Ability to tolerate increased activity will improve Outcome: Not Progressing   Problem: Pain Management: Goal: Pain level will decrease Outcome: Not Progressing

## 2023-03-04 MED ORDER — ENOXAPARIN SODIUM 40 MG/0.4ML IJ SOSY
40.0000 mg | PREFILLED_SYRINGE | Freq: Every day | INTRAMUSCULAR | Status: DC
  Administered 2023-03-04 – 2023-03-06 (×3): 40 mg via SUBCUTANEOUS
  Filled 2023-03-04 (×3): qty 0.4

## 2023-03-04 MED ORDER — KETOROLAC TROMETHAMINE 15 MG/ML IJ SOLN
15.0000 mg | Freq: Three times a day (TID) | INTRAMUSCULAR | Status: DC
  Administered 2023-03-04 – 2023-03-06 (×6): 15 mg via INTRAVENOUS
  Filled 2023-03-04 (×7): qty 1

## 2023-03-04 MED ORDER — HYDROMORPHONE HCL 1 MG/ML IJ SOLN
0.5000 mg | Freq: Four times a day (QID) | INTRAMUSCULAR | Status: DC | PRN
  Administered 2023-03-04 – 2023-03-05 (×3): 1 mg via INTRAVENOUS
  Filled 2023-03-04 (×3): qty 1
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
## 2023-03-04 NOTE — Progress Notes (Signed)
Continues to progress well.  Pain better controlled.  No new radicular numbness or weakness.  Afebrile.  Vital signs are stable.  Awake and alert.  Oriented and appropriate.  Motor/function intact.  Dressing clean and dry.  Progressing well following thoracolumbar fusion.  Continue close mobilization and therapy.

## 2023-03-04 NOTE — Plan of Care (Signed)
  Problem: Clinical Measurements: Goal: Will remain free from infection Outcome: Progressing   Problem: Education: Goal: Ability to verbalize activity precautions or restrictions will improve Outcome: Progressing Goal: Knowledge of the prescribed therapeutic regimen will improve Outcome: Progressing Goal: Understanding of discharge needs will improve Outcome: Progressing   Problem: Activity: Goal: Ability to avoid complications of mobility impairment will improve Outcome: Progressing Goal: Ability to tolerate increased activity will improve Outcome: Progressing Goal: Will remain free from falls Outcome: Progressing   Problem: Bowel/Gastric: Goal: Gastrointestinal status for postoperative course will improve Outcome: Progressing   Problem: Clinical Measurements: Goal: Ability to maintain clinical measurements within normal limits will improve Outcome: Progressing Goal: Postoperative complications will be avoided or minimized Outcome: Progressing Goal: Diagnostic test results will improve Outcome: Progressing   Problem: Pain Management: Goal: Pain level will decrease Outcome: Progressing   Problem: Skin Integrity: Goal: Will show signs of wound healing Outcome: Progressing   Problem: Health Behavior/Discharge Planning: Goal: Identification of resources available to assist in meeting health care needs will improve Outcome: Progressing   Problem: Bladder/Genitourinary: Goal: Urinary functional status for postoperative course will improve Outcome: Progressing
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
## 2023-03-05 LAB — COMPREHENSIVE METABOLIC PANEL
ALT: 55 U/L — ABNORMAL HIGH (ref 0–44)
AST: 35 U/L (ref 15–41)
Albumin: 2.7 g/dL — ABNORMAL LOW (ref 3.5–5.0)
Alkaline Phosphatase: 171 U/L — ABNORMAL HIGH (ref 38–126)
Anion gap: 10 (ref 5–15)
BUN: <5 mg/dL — ABNORMAL LOW (ref 6–20)
CO2: 25 mmol/L (ref 22–32)
Calcium: 8.7 mg/dL — ABNORMAL LOW (ref 8.9–10.3)
Chloride: 103 mmol/L (ref 98–111)
Creatinine, Ser: 0.61 mg/dL (ref 0.44–1.00)
GFR, Estimated: >60 mL/min (ref >60)
Glucose, Bld: 110 mg/dL — ABNORMAL HIGH (ref 70–99)
Potassium: 3.1 mmol/L — ABNORMAL LOW (ref 3.5–5.1)
Sodium: 138 mmol/L (ref 135–145)
Total Bilirubin: 0.5 mg/dL (ref 0.3–1.2)
Total Protein: 5.5 g/dL — ABNORMAL LOW (ref 6.5–8.1)

## 2023-03-05 LAB — CBC
HCT: 26.3 % — ABNORMAL LOW (ref 36.0–46.0)
Hemoglobin: 8.6 g/dL — ABNORMAL LOW (ref 12.0–15.0)
MCH: 32.3 pg (ref 26.0–34.0)
MCHC: 32.7 g/dL (ref 30.0–36.0)
MCV: 98.9 fL (ref 80.0–100.0)
Platelets: 207 10*3/uL (ref 150–400)
RBC: 2.66 MIL/uL — ABNORMAL LOW (ref 3.87–5.11)
RDW: 13.6 % (ref 11.5–15.5)
WBC: 4.9 10*3/uL (ref 4.0–10.5)
nRBC: 0 % (ref 0.0–0.2)

## 2023-03-05 MED ORDER — OXYCODONE HCL 5 MG PO TABS
15.0000 mg | ORAL_TABLET | ORAL | Status: DC | PRN
  Administered 2023-03-05 – 2023-03-06 (×6): 15 mg via ORAL
  Filled 2023-03-05 (×6): qty 3

## 2023-03-05 MED ORDER — POTASSIUM CHLORIDE 20 MEQ PO PACK
40.0000 meq | PACK | Freq: Two times a day (BID) | ORAL | Status: AC
  Administered 2023-03-05 (×2): 40 meq via ORAL
  Filled 2023-03-05 (×2): qty 2

## 2023-03-05 MED ORDER — HYDROMORPHONE HCL 1 MG/ML IJ SOLN
0.5000 mg | Freq: Three times a day (TID) | INTRAMUSCULAR | Status: DC | PRN
  Administered 2023-03-05: 1 mg via INTRAVENOUS
  Filled 2023-03-05: qty 1

## 2023-03-05 NOTE — Progress Notes (Cosign Needed Addendum)
Patient is confined to one room and is unable to ambulate to the bathroom, therefore needing a commode at the bedside.    Ritika Hellickson W. Leonilda Cozby, RN, BSN  Trauma/Neuro ICU Case Manager [phone]  
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
## 2023-03-05 NOTE — Progress Notes (Signed)
Inpatient Rehab Admissions Coordinator:  Note PT recommending outpatient therapy and OT is not recommending further follow up. Pt no longer requires the intensity of CIR. TOC made aware. AC will sign off.   Wolfgang Phoenix, MS, CCC-SLP Admissions Coordinator [phone]
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
## 2023-03-06 LAB — COMPREHENSIVE METABOLIC PANEL
ALT: 40 U/L (ref 0–44)
AST: 20 U/L (ref 15–41)
Albumin: 2.7 g/dL — ABNORMAL LOW (ref 3.5–5.0)
Alkaline Phosphatase: 127 U/L — ABNORMAL HIGH (ref 38–126)
Anion gap: 8 (ref 5–15)
BUN: 6 mg/dL (ref 6–20)
CO2: 24 mmol/L (ref 22–32)
Calcium: 8.5 mg/dL — ABNORMAL LOW (ref 8.9–10.3)
Chloride: 107 mmol/L (ref 98–111)
Creatinine, Ser: 0.62 mg/dL (ref 0.44–1.00)
GFR, Estimated: >60 mL/min (ref >60)
Glucose, Bld: 101 mg/dL — ABNORMAL HIGH (ref 70–99)
Potassium: 4.2 mmol/L (ref 3.5–5.1)
Sodium: 139 mmol/L (ref 135–145)
Total Bilirubin: 0.5 mg/dL (ref 0.3–1.2)
Total Protein: 5.3 g/dL — ABNORMAL LOW (ref 6.5–8.1)

## 2023-03-06 LAB — MAGNESIUM: Magnesium: 2.1 mg/dL (ref 1.7–2.4)

## 2023-03-06 MED ORDER — ACETAMINOPHEN 500 MG PO TABS: 1000.0000 mg | ORAL_TABLET | Freq: Four times a day (QID) | ORAL | 0 refills | Status: DC

## 2023-03-06 MED ORDER — METHOCARBAMOL 1000 MG PO TABS: 1000.0000 mg | ORAL_TABLET | Freq: Four times a day (QID) | ORAL | 1 refills | Status: DC

## 2023-03-06 MED ORDER — SENNOSIDES-DOCUSATE SODIUM 8.6-50 MG PO TABS: 1.0000 | ORAL_TABLET | Freq: Two times a day (BID) | ORAL | Status: DC

## 2023-03-06 MED ORDER — POLYETHYLENE GLYCOL 3350 17 G PO PACK: 17.0000 g | PACK | Freq: Two times a day (BID) | ORAL | 0 refills | Status: DC

## 2023-03-06 MED ORDER — PREGABALIN 50 MG PO CAPS: 50.0000 mg | ORAL_CAPSULE | Freq: Three times a day (TID) | ORAL | 0 refills | Status: DC

## 2023-03-06 MED ORDER — OXYCODONE HCL 15 MG PO TABS: 15.0000 mg | ORAL_TABLET | Freq: Four times a day (QID) | ORAL | 0 refills | Status: DC | PRN

## 2023-03-06 MED ORDER — CELECOXIB 200 MG PO CAPS: 200.0000 mg | ORAL_CAPSULE | Freq: Two times a day (BID) | ORAL | 0 refills | Status: DC
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
## 2023-03-06 NOTE — Progress Notes (Signed)
Patient's IV was taken out, catheter tip intact. Discharge paperwork was given & gone over with the patient and her husband. They denied any questions and were going to get the patient dressed and ready to go. Ivar Drape the Diplomatic Services operational officer took the patient down.

## 2023-03-06 NOTE — Plan of Care (Signed)
Problem: Clinical Measurements: Goal: Will remain free from infection Outcome: Progressing   Problem: Education: Goal: Ability to verbalize activity precautions or restrictions will improve Outcome: Progressing Goal: Knowledge of the prescribed therapeutic regimen will improve Outcome: Progressing Goal: Understanding of discharge needs will improve Outcome: Progressing   Problem: Activity: Goal: Ability to avoid complications of mobility impairment will improve Outcome: Progressing Goal: Ability to tolerate increased activity will improve Outcome: Progressing Goal: Will remain free from falls Outcome: Progressing   Problem: Bowel/Gastric: Goal: Gastrointestinal status for postoperative course will improve Outcome: Progressing   Problem: Clinical Measurements: Goal: Ability to maintain clinical measurements within normal limits will improve Outcome: Progressing Goal: Postoperative complications will be avoided or minimized Outcome: Progressing Goal: Diagnostic test results will improve Outcome: Progressing   Problem: Pain Management: Goal: Pain level will decrease Outcome: Progressing   Problem: Skin Integrity: Goal: Will show signs of wound healing Outcome: Progressing   Problem: Health Behavior/Discharge Planning: Goal: Identification of resources available to assist in meeting health care needs will improve Outcome: Progressing   Problem: Bladder/Genitourinary: Goal: Urinary functional status for postoperative course will improve Outcome: Progressing   Problem: Clinical Measurements: Goal: Will remain free from infection Outcome: Progressing   Problem: Education: Goal: Ability to verbalize activity precautions or restrictions will improve Outcome: Progressing Goal: Knowledge of the prescribed therapeutic regimen will improve Outcome: Progressing Goal: Understanding of discharge needs will improve Outcome: Progressing   Problem: Activity: Goal: Ability  to avoid complications of mobility impairment will improve Outcome: Progressing Goal: Ability to tolerate increased activity will improve Outcome: Progressing Goal: Will remain free from falls Outcome: Progressing   Problem: Bowel/Gastric: Goal: Gastrointestinal status for postoperative course will improve Outcome: Progressing   Problem: Clinical Measurements: Goal: Ability to maintain clinical measurements within normal limits will improve Outcome: Progressing Goal: Postoperative complications will be avoided or minimized Outcome: Progressing Goal: Diagnostic test results will improve Outcome: Progressing   Problem: Pain Management: Goal: Pain level will decrease Outcome: Progressing   Problem: Skin Integrity: Goal: Will show signs of wound healing Outcome: Progressing   Problem: Health Behavior/Discharge Planning: Goal: Identification of resources available to assist in meeting health care needs will improve Outcome: Progressing   Problem: Bladder/Genitourinary: Goal: Urinary functional status for postoperative course will improve Outcome: Progressing   Problem: Clinical Measurements: Goal: Will remain free from infection Outcome: Progressing   Problem: Education: Goal: Ability to verbalize activity precautions or restrictions will improve Outcome: Progressing Goal: Knowledge of the prescribed therapeutic regimen will improve Outcome: Progressing Goal: Understanding of discharge needs will improve Outcome: Progressing   Problem: Activity: Goal: Ability to avoid complications of mobility impairment will improve Outcome: Progressing Goal: Ability to tolerate increased activity will improve Outcome: Progressing Goal: Will remain free from falls Outcome: Progressing   Problem: Bowel/Gastric: Goal: Gastrointestinal status for postoperative course will improve Outcome: Progressing   Problem: Clinical Measurements: Goal: Ability to maintain clinical measurements  within normal limits will improve Outcome: Progressing Goal: Postoperative complications will be avoided or minimized Outcome: Progressing Goal: Diagnostic test results will improve Outcome: Progressing   Problem: Pain Management: Goal: Pain level will decrease Outcome: Progressing   Problem: Skin Integrity: Goal: Will show signs of wound healing Outcome: Progressing   Problem: Health Behavior/Discharge Planning: Goal: Identification of resources available to assist in meeting health care needs will improve Outcome: Progressing   Problem: Bladder/Genitourinary: Goal: Urinary functional status for postoperative course will improve Outcome: Progressing   Problem: Clinical Measurements: Goal: Will remain free from infection Outcome:  Progressing   Problem: Education: Goal: Ability to verbalize activity precautions or restrictions will improve Outcome: Progressing Goal: Knowledge of the prescribed therapeutic regimen will improve Outcome: Progressing Goal: Understanding of discharge needs will improve Outcome: Progressing   Problem: Activity: Goal: Ability to avoid complications of mobility impairment will improve Outcome: Progressing Goal: Ability to tolerate increased activity will improve Outcome: Progressing Goal: Will remain free from falls Outcome: Progressing   Problem: Bowel/Gastric: Goal: Gastrointestinal status for postoperative course will improve Outcome: Progressing   Problem: Clinical Measurements: Goal: Ability to maintain clinical measurements within normal limits will improve Outcome: Progressing Goal: Postoperative complications will be avoided or minimized Outcome: Progressing Goal: Diagnostic test results will improve Outcome: Progressing   Problem: Pain Management: Goal: Pain level will decrease Outcome: Progressing   Problem: Skin Integrity: Goal: Will show signs of wound healing Outcome: Progressing   Problem: Health Behavior/Discharge  Planning: Goal: Identification of resources available to assist in meeting health care needs will improve Outcome: Progressing   Problem: Bladder/Genitourinary: Goal: Urinary functional status for postoperative course will improve Outcome: Progressing

## 2023-03-06 NOTE — Progress Notes (Signed)
Patient ID: Taylor Chandler, female   DOB: [birth year], 47 y.o.   MRN: [id number] She looks really good today.  She is sitting up in bed playing on her computer.  She is smiling and happy.  Her pain is much better.  She is ambulating well.  She thinks she may get to go home today.  She needs to follow-up with me in the office in about a week.  Incision looks good and appears to be healing well.

## 2023-03-06 NOTE — Discharge Instructions (Signed)
You are currently on CELEBREX, which is an NSAID medication. With your history of ulcer as well as current use of celebrex, I think it is best to AVOID ADVIL, IBUPROFEN, OTHER NSAIDS at home.

## 2023-03-07 ENCOUNTER — Encounter (HOSPITAL_COMMUNITY): Payer: Self-pay | Admitting: Neurological Surgery

## 2023-03-08 ENCOUNTER — Ambulatory Visit: Admitting: Physical Therapy
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
## 2023-03-16 ENCOUNTER — Ambulatory Visit

## 2023-03-28 ENCOUNTER — Ambulatory Visit: Admitting: Physical Therapy

## 2023-03-28 ENCOUNTER — Telehealth: Payer: Self-pay | Admitting: *Deleted

## 2023-03-28 NOTE — Telephone Encounter (Signed)
Auth pending for [id number], [id number], [id number], [id number], [id number]/ [employer]

## 2023-04-02 ENCOUNTER — Other Ambulatory Visit: Payer: Self-pay | Admitting: Gastroenterology

## 2023-04-02 DIAGNOSIS — D128 Benign neoplasm of rectum: Secondary | ICD-10-CM

## 2023-04-02 DIAGNOSIS — D127 Benign neoplasm of rectosigmoid junction: Secondary | ICD-10-CM

## 2023-04-02 DIAGNOSIS — Z800 Family history of malignant neoplasm of digestive organs: Secondary | ICD-10-CM

## 2023-04-02 DIAGNOSIS — K5909 Other constipation: Secondary | ICD-10-CM

## 2023-04-02 DIAGNOSIS — R7989 Other specified abnormal findings of blood chemistry: Secondary | ICD-10-CM

## 2023-04-03 NOTE — Telephone Encounter (Signed)
Patient needs schedule follow-up appointment.

## 2023-04-11 ENCOUNTER — Telehealth: Payer: Self-pay | Admitting: *Deleted

## 2023-04-11 NOTE — Telephone Encounter (Signed)
[employer] requesting additional information, information provided via portal  [employer]: WAS BREAST RECONSTRUCTION SURGERY PAID BY [employer]? SUBMIT VIA WEB OR ATTACH DOCUMENTS ON REFERRAL DETAIL PAGE USING THE REQUEST UPDATE BUTTON @ [email or web address]  Response to [employer]: Additional information requested references a previous breast reduction yet ICD-10 codes do not reference previous breast reduction but rather History of BREAST CANCER, History of MASTECTOMY, BILATERAL, and History of BREAST RECONSTRUCTION. As for if previous life saving surgery for breast cancer was covered by [employer], yes. Yes it was. Information which should be available in the [employer]. This is not a request for Cosmetic Surgery.

## 2023-05-07 ENCOUNTER — Ambulatory Visit: Admitting: Surgical

## 2023-05-07 ENCOUNTER — Encounter: Payer: Self-pay | Admitting: Surgical

## 2023-05-07 VITALS — BP 141/87 | HR 90

## 2023-05-07 DIAGNOSIS — Z9013 Acquired absence of bilateral breasts and nipples: Secondary | ICD-10-CM

## 2023-05-07 DIAGNOSIS — Z853 Personal history of malignant neoplasm of breast: Secondary | ICD-10-CM

## 2023-05-07 DIAGNOSIS — Z9889 Other specified postprocedural states: Secondary | ICD-10-CM
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
## 2023-05-09 ENCOUNTER — Encounter (HOSPITAL_BASED_OUTPATIENT_CLINIC_OR_DEPARTMENT_OTHER): Payer: Self-pay | Admitting: Plastic Surgery

## 2023-05-15 MED ORDER — ONDANSETRON HCL 4 MG PO TABS: 4.0000 mg | ORAL_TABLET | Freq: Three times a day (TID) | ORAL | 0 refills | Status: AC | PRN

## 2023-05-15 MED ORDER — OXYCODONE HCL 5 MG PO TABS: 5.0000 mg | ORAL_TABLET | Freq: Four times a day (QID) | ORAL | 0 refills | Status: AC | PRN

## 2023-05-15 MED ORDER — CEPHALEXIN 500 MG PO CAPS
500.0000 mg | ORAL_CAPSULE | Freq: Four times a day (QID) | ORAL | 0 refills | Status: AC
Start: 2023-05-15 — End: 2023-05-20

## 2023-05-15 NOTE — Addendum Note (Signed)
Addended byKeenan Bachelor on: 05/15/2023 03:25 PM   Modules accepted: Orders

## 2023-05-16 ENCOUNTER — Other Ambulatory Visit: Payer: Self-pay

## 2023-05-16 ENCOUNTER — Encounter (HOSPITAL_BASED_OUTPATIENT_CLINIC_OR_DEPARTMENT_OTHER): Payer: Self-pay | Admitting: Plastic Surgery

## 2023-05-16 ENCOUNTER — Encounter (HOSPITAL_BASED_OUTPATIENT_CLINIC_OR_DEPARTMENT_OTHER)
Admission: RE | Disposition: A | Discharge status: Home / Self Care | Payer: Self-pay | Source: Home / Self Care | Attending: Plastic Surgery

## 2023-05-16 ENCOUNTER — Ambulatory Visit (HOSPITAL_BASED_OUTPATIENT_CLINIC_OR_DEPARTMENT_OTHER): Payer: Self-pay | Admitting: Anesthesiology

## 2023-05-16 ENCOUNTER — Ambulatory Visit (HOSPITAL_BASED_OUTPATIENT_CLINIC_OR_DEPARTMENT_OTHER): Admitting: Anesthesiology

## 2023-05-16 ENCOUNTER — Ambulatory Visit (HOSPITAL_BASED_OUTPATIENT_CLINIC_OR_DEPARTMENT_OTHER)
Admission: RE | Admit: 2023-05-16 | Discharge: 2023-05-16 | Disposition: A | Discharge status: Home / Self Care | Attending: Plastic Surgery | Admitting: Plastic Surgery

## 2023-05-16 DIAGNOSIS — M329 Systemic lupus erythematosus, unspecified: Secondary | ICD-10-CM | POA: Diagnosis not present

## 2023-05-16 DIAGNOSIS — Z923 Personal history of irradiation: Secondary | ICD-10-CM | POA: Diagnosis not present

## 2023-05-16 DIAGNOSIS — K219 Gastro-esophageal reflux disease without esophagitis: Secondary | ICD-10-CM | POA: Diagnosis not present

## 2023-05-16 DIAGNOSIS — Z9013 Acquired absence of bilateral breasts and nipples: Secondary | ICD-10-CM | POA: Insufficient documentation

## 2023-05-16 DIAGNOSIS — Z803 Family history of malignant neoplasm of breast: Secondary | ICD-10-CM | POA: Diagnosis not present

## 2023-05-16 DIAGNOSIS — Z853 Personal history of malignant neoplasm of breast: Secondary | ICD-10-CM | POA: Insufficient documentation

## 2023-05-16 DIAGNOSIS — N6489 Other specified disorders of breast: Secondary | ICD-10-CM | POA: Insufficient documentation

## 2023-05-16 DIAGNOSIS — M3500 Sicca syndrome, unspecified: Secondary | ICD-10-CM | POA: Diagnosis not present

## 2023-05-16 DIAGNOSIS — Z421 Encounter for breast reconstruction following mastectomy: Secondary | ICD-10-CM | POA: Diagnosis not present

## 2023-05-16 DIAGNOSIS — Z01818 Encounter for other preprocedural examination: Secondary | ICD-10-CM

## 2023-05-16 DIAGNOSIS — N651 Disproportion of reconstructed breast: Secondary | ICD-10-CM | POA: Diagnosis not present

## 2023-05-16 HISTORY — DX: Cerebral infarction, unspecified: I63.9

## 2023-05-16 HISTORY — PX: LIPOSUCTION WITH LIPOFILLING: SHX6436

## 2023-05-16 HISTORY — DX: Hypothyroidism, unspecified: E03.9

## 2023-05-16 SURGERY — REVISION, RECONSTRUCTION, BREAST
Anesthesia: General | Site: Breast | Laterality: Right

## 2023-05-16 MED ORDER — ONDANSETRON HCL 4 MG/2ML IJ SOLN
INTRAMUSCULAR | Status: AC
  Filled 2023-05-16: qty 2

## 2023-05-16 MED ORDER — FENTANYL CITRATE (PF) 100 MCG/2ML IJ SOLN
INTRAMUSCULAR | Status: AC
  Filled 2023-05-16: qty 2

## 2023-05-16 MED ORDER — DEXAMETHASONE SODIUM PHOSPHATE 10 MG/ML IJ SOLN
INTRAMUSCULAR | Status: DC | PRN
  Administered 2023-05-16: 10 mg via INTRAVENOUS

## 2023-05-16 MED ORDER — MIDAZOLAM HCL 2 MG/2ML IJ SOLN
INTRAMUSCULAR | Status: AC
  Filled 2023-05-16: qty 2

## 2023-05-16 MED ORDER — PROPOFOL 10 MG/ML IV BOLUS
INTRAVENOUS | Status: DC | PRN
  Administered 2023-05-16: 150 mg via INTRAVENOUS

## 2023-05-16 MED ORDER — LIDOCAINE HCL (PF) 1 % IJ SOLN
INTRAMUSCULAR | Status: AC
  Filled 2023-05-16: qty 60

## 2023-05-16 MED ORDER — SODIUM CHLORIDE 0.9% FLUSH: 3.0000 mL | Freq: Two times a day (BID) | INTRAVENOUS | Status: DC

## 2023-05-16 MED ORDER — LIDOCAINE-EPINEPHRINE 1 %-1:100000 IJ SOLN
INTRAMUSCULAR | Status: DC | PRN
  Administered 2023-05-16: 15 mL via INTRAMUSCULAR

## 2023-05-16 MED ORDER — CEFAZOLIN SODIUM-DEXTROSE 2-4 GM/100ML-% IV SOLN
2.0000 g | INTRAVENOUS | Status: AC
  Administered 2023-05-16: 2 g via INTRAVENOUS

## 2023-05-16 MED ORDER — OXYCODONE HCL 5 MG PO TABS
5.0000 mg | ORAL_TABLET | ORAL | Status: DC | PRN
  Administered 2023-05-16: 5 mg via ORAL

## 2023-05-16 MED ORDER — BUPIVACAINE HCL (PF) 0.25 % IJ SOLN
INTRAMUSCULAR | Status: AC
  Filled 2023-05-16: qty 30

## 2023-05-16 MED ORDER — FENTANYL CITRATE (PF) 100 MCG/2ML IJ SOLN
INTRAMUSCULAR | Status: DC | PRN
  Administered 2023-05-16 (×4): 50 ug via INTRAVENOUS

## 2023-05-16 MED ORDER — EPINEPHRINE PF 1 MG/ML IJ SOLN
INTRAMUSCULAR | Status: AC
  Filled 2023-05-16: qty 1

## 2023-05-16 MED ORDER — ACETAMINOPHEN 325 MG RE SUPP: 650.0000 mg | RECTAL | Status: DC | PRN

## 2023-05-16 MED ORDER — LIDOCAINE 2% (20 MG/ML) 5 ML SYRINGE
INTRAMUSCULAR | Status: AC
  Filled 2023-05-16: qty 5

## 2023-05-16 MED ORDER — DEXAMETHASONE SODIUM PHOSPHATE 10 MG/ML IJ SOLN
INTRAMUSCULAR | Status: AC
  Filled 2023-05-16: qty 1

## 2023-05-16 MED ORDER — LACTATED RINGERS IV SOLN
INTRAVENOUS | Status: DC | PRN
  Administered 2023-05-16: 500 mL

## 2023-05-16 MED ORDER — SODIUM CHLORIDE 0.9 % IV SOLN: 250.0000 mL | INTRAVENOUS | Status: DC | PRN

## 2023-05-16 MED ORDER — OXYCODONE HCL 5 MG PO TABS
ORAL_TABLET | ORAL | Status: AC
  Filled 2023-05-16: qty 1

## 2023-05-16 MED ORDER — SODIUM CHLORIDE (PF) 0.9 % IJ SOLN
INTRAMUSCULAR | Status: AC
  Filled 2023-05-16: qty 40

## 2023-05-16 MED ORDER — ONDANSETRON HCL 4 MG/2ML IJ SOLN
INTRAMUSCULAR | Status: DC | PRN
Start: 2023-05-16 — End: 2023-05-16
  Administered 2023-05-16: 4 mg via INTRAVENOUS

## 2023-05-16 MED ORDER — ACETAMINOPHEN 325 MG PO TABS: 650.0000 mg | ORAL_TABLET | ORAL | Status: DC | PRN

## 2023-05-16 MED ORDER — CHLORHEXIDINE GLUCONATE CLOTH 2 % EX PADS: 6.0000 | MEDICATED_PAD | Freq: Once | CUTANEOUS | Status: DC

## 2023-05-16 MED ORDER — MIDAZOLAM HCL 5 MG/5ML IJ SOLN
INTRAMUSCULAR | Status: DC | PRN
  Administered 2023-05-16: 2 mg via INTRAVENOUS

## 2023-05-16 MED ORDER — LACTATED RINGERS IV SOLN: INTRAVENOUS | Status: DC

## 2023-05-16 MED ORDER — BUPIVACAINE LIPOSOME 1.3 % IJ SUSP
INTRAMUSCULAR | Status: AC
  Filled 2023-05-16: qty 20

## 2023-05-16 MED ORDER — LIDOCAINE HCL (CARDIAC) PF 100 MG/5ML IV SOSY
PREFILLED_SYRINGE | INTRAVENOUS | Status: DC | PRN
  Administered 2023-05-16: 80 mg via INTRAVENOUS

## 2023-05-16 MED ORDER — FENTANYL CITRATE (PF) 100 MCG/2ML IJ SOLN
25.0000 ug | INTRAMUSCULAR | Status: DC | PRN
  Administered 2023-05-16 (×2): 50 ug via INTRAVENOUS

## 2023-05-16 MED ORDER — LIDOCAINE-EPINEPHRINE 1 %-1:100000 IJ SOLN
INTRAMUSCULAR | Status: AC
  Filled 2023-05-16: qty 1

## 2023-05-16 MED ORDER — SODIUM CHLORIDE 0.9% FLUSH: 3.0000 mL | INTRAVENOUS | Status: DC | PRN

## 2023-05-16 MED ORDER — FENTANYL CITRATE (PF) 100 MCG/2ML IJ SOLN: 25.0000 ug | INTRAMUSCULAR | Status: DC | PRN

## 2023-05-16 MED ORDER — VASHE WOUND IRRIGATION OPTIME
TOPICAL | Status: DC | PRN
  Administered 2023-05-16: 34 [oz_av]

## 2023-05-16 MED ORDER — CEFAZOLIN SODIUM-DEXTROSE 2-4 GM/100ML-% IV SOLN
INTRAVENOUS | Status: AC
  Filled 2023-05-16: qty 100

## 2023-05-16 MED ORDER — PROPOFOL 10 MG/ML IV BOLUS
INTRAVENOUS | Status: AC
  Filled 2023-05-16: qty 20

## 2023-05-16 SURGICAL SUPPLY — 49 items
ADH SKN CLS APL DERMABOND .7 (GAUZE/BANDAGES/DRESSINGS) ×2
BINDER ABDOMINAL 10 UNV 27-48 (MISCELLANEOUS) IMPLANT
BINDER ABDOMINAL 12 SM 30-45 (SOFTGOODS) IMPLANT
BINDER ABDOMINAL 9 SM 30-45 (SOFTGOODS) IMPLANT
BINDER BREAST LRG (GAUZE/BANDAGES/DRESSINGS) IMPLANT
BINDER BREAST MEDIUM (GAUZE/BANDAGES/DRESSINGS) IMPLANT
BINDER BREAST XLRG (GAUZE/BANDAGES/DRESSINGS) IMPLANT
BINDER BREAST XXLRG (GAUZE/BANDAGES/DRESSINGS) IMPLANT
BLADE HEX COATED 2.75 (ELECTRODE) IMPLANT
BLADE SURG 15 STRL LF DISP TIS (BLADE) ×2 IMPLANT
BLADE SURG 15 STRL SS (BLADE) ×2
CLSR STERI-STRIP ANTIMIC 1/2X4 (GAUZE/BANDAGES/DRESSINGS) IMPLANT
COVER BACK TABLE 60X90IN (DRAPES) ×2 IMPLANT
COVER MAYO STAND STRL (DRAPES) ×2 IMPLANT
DERMABOND ADVANCED .7 DNX12 (GAUZE/BANDAGES/DRESSINGS) ×2 IMPLANT
DRAPE LAPAROSCOPIC ABDOMINAL (DRAPES) ×2 IMPLANT
DRESSING MEPILEX FLEX 4X4 (GAUZE/BANDAGES/DRESSINGS) IMPLANT
DRSG MEPILEX FLEX 4X4 (GAUZE/BANDAGES/DRESSINGS) ×6
ELECT REM PT RETURN 9FT ADLT (ELECTROSURGICAL) ×2
ELECTRODE REM PT RTRN 9FT ADLT (ELECTROSURGICAL) ×2 IMPLANT
EXTRACTOR CANIST REVOLVE STRL (CANNISTER) ×2 IMPLANT
GAUZE PAD ABD 8X10 STRL (GAUZE/BANDAGES/DRESSINGS) ×4 IMPLANT
GLOVE BIO SURGEON STRL SZ 6.5 (GLOVE) ×6 IMPLANT
GOWN STRL REUS W/ TWL LRG LVL3 (GOWN DISPOSABLE) ×4 IMPLANT
GOWN STRL REUS W/TWL LRG LVL3 (GOWN DISPOSABLE) ×4
IV LACTATED RINGERS 1000ML (IV SOLUTION) ×4 IMPLANT
LINER CANISTER 1000CC FLEX (MISCELLANEOUS) ×2 IMPLANT
NDL HYPO 25X1 1.5 SAFETY (NEEDLE) ×2 IMPLANT
NEEDLE HYPO 25X1 1.5 SAFETY (NEEDLE) ×2
PACK BASIN DAY SURGERY FS (CUSTOM PROCEDURE TRAY) ×2 IMPLANT
PAD ALCOHOL SWAB (MISCELLANEOUS) ×2 IMPLANT
PENCIL SMOKE EVACUATOR (MISCELLANEOUS) IMPLANT
SLEEVE SCD COMPRESS KNEE MED (STOCKING) ×2 IMPLANT
SPIKE FLUID TRANSFER (MISCELLANEOUS) IMPLANT
SPONGE T-LAP 18X18 ~~LOC~~+RFID (SPONGE) ×2 IMPLANT
SUT MNCRL AB 4-0 PS2 18 (SUTURE) IMPLANT
SUT MON AB 3-0 SH 27 (SUTURE) ×2
SUT MON AB 3-0 SH27 (SUTURE) IMPLANT
SUT MON AB 5-0 PS2 18 (SUTURE) ×4 IMPLANT
SYR 10ML LL (SYRINGE) ×10 IMPLANT
SYR 3ML 18GX1 1/2 (SYRINGE) IMPLANT
SYR 50ML LL SCALE MARK (SYRINGE) ×2 IMPLANT
SYR CONTROL 10ML LL (SYRINGE) ×2 IMPLANT
SYR TOOMEY 50ML (SYRINGE) IMPLANT
TOWEL GREEN STERILE FF (TOWEL DISPOSABLE) ×4 IMPLANT
TRAY DSU PREP LF (CUSTOM PROCEDURE TRAY) ×2 IMPLANT
TUBING INFILTRATION IT-10001 (TUBING) ×2 IMPLANT
TUBING SET GRADUATE ASPIR 12FT (MISCELLANEOUS) ×2 IMPLANT
UNDERPAD 30X36 HEAVY ABSORB (UNDERPADS AND DIAPERS) ×4 IMPLANT

## 2023-05-16 NOTE — Anesthesia Preprocedure Evaluation (Addendum)
Anesthesia Evaluation  Patient identified by MRN, date of birth, ID band Patient awake    Reviewed: Allergy & Precautions, NPO status , Patient's Chart, lab work & pertinent test results  History of Anesthesia Complications Negative for: history of anesthetic complications  Airway Mallampati: II  TM Distance: >3 FB Neck ROM: Full   Comment: Previous grade I view with glidescope 3 (due to c-spine not being cleared at that time) Dental  (+) Dental Advisory Given   Pulmonary neg pulmonary ROS   Pulmonary exam normal breath sounds clear to auscultation       Cardiovascular (-) hypertension(-) angina (-) Past MI, (-) Cardiac Stents and (-) CABG + dysrhythmias (prolonged QT)  Rhythm:Regular Rate:Normal  PFO   Neuro/Psych neg Seizures TIA (2015) Neuromuscular disease (s/p lumbar fusion)    GI/Hepatic negative GI ROS, Neg liver ROS,,,  Endo/Other  Hypothyroidism    Renal/GU negative Renal ROS     Musculoskeletal   Abdominal   Peds  Hematology negative hematology ROS (+)   Anesthesia Other Findings lupus  Reproductive/Obstetrics Endometriosis, h/o breast cancer                             Anesthesia Physical Anesthesia Plan  ASA: 2  Anesthesia Plan: General   Post-op Pain Management: Tylenol PO (pre-op)*   Induction: Intravenous  PONV Risk Score and Plan: 3 and Ondansetron, Dexamethasone and Treatment may vary due to age or medical condition  Airway Management Planned:   Additional Equipment:   Intra-op Plan:   Post-operative Plan: Extubation in OR  Informed Consent: I have reviewed the patients History and Physical, chart, labs and discussed the procedure including the risks, benefits and alternatives for the proposed anesthesia with the patient or authorized representative who has indicated his/her understanding and acceptance.     Dental advisory given  Plan Discussed with:  CRNA and Anesthesiologist  Anesthesia Plan Comments: (Risks of general anesthesia discussed including, but not limited to, sore throat, hoarse voice, chipped/damaged teeth, injury to vocal cords, nausea and vomiting, allergic reactions, lung infection, heart attack, stroke, and death. All questions answered. )        Anesthesia Quick Evaluation

## 2023-05-16 NOTE — Discharge Instructions (Addendum)
INSTRUCTIONS FOR AFTER BREAST SURGERY   You will likely have some questions about what to expect following your operation.  The following information will help you and your family understand what to expect when you are discharged from the hospital.  It is important to follow these guidelines to help ensure a smooth recovery and reduce complication.  Postoperative instructions include information on: diet, wound care, medications and physical activity.  AFTER SURGERY Expect to go home after the procedure.  In some cases, you may need to spend one night in the hospital for observation.  DIET Breast surgery does not require a specific diet.  However, the healthier you eat the better your body will heal. It is important to increasing your protein intake.  This means limiting the foods with sugar and carbohydrates.  Focus on vegetables and some meat.  If you have liposuction during your procedure be sure to drink water.  If your urine is bright yellow, then it is concentrated, and you need to drink more water.  As a general rule after surgery, you should have 8 ounces of water every hour while awake.  If you find you are persistently nauseated or unable to take in liquids let us know.  NO TOBACCO USE or EXPOSURE.  This will slow your healing process and lead to a wound.  WOUND CARE Leave the binder on for 3 days . Use fragrance free soap like Dial, Dove or [place].   After 3 days you can remove the binder to shower. Once dry apply binder or sports bra. If you have liposuction you will have a soft and spongy dressing (Lipofoam) that helps prevent creases in your skin.  Remove before you shower and then replace it.  It is also available on [employer]. If you have steri-strips / tape directly attached to your skin leave them in place. It is OK to get these wet.   No baths, pools or hot tubs for four weeks. We close your incision to leave the smallest and best-looking scar. No ointment or creams on your incisions  for four weeks.  No Neosporin (Too many skin reactions).  A few weeks after surgery you can use Mederma and start massaging the scar. We ask you to wear your binder or sports bra for the first 6 weeks around the clock, including while sleeping. This provides added comfort and helps reduce the fluid accumulation at the surgery site. NO Ice or heating pads to the operative site.  You have a very high risk of a BURN before you feel the temperature change.  ACTIVITY No heavy lifting until cleared by the doctor.  This usually means no more than a half-gallon of milk.  It is OK to walk and climb stairs. Moving your legs is very important to decrease your risk of a blood clot.  It will also help keep you from getting deconditioned.  Every 1 to 2 hours get up and walk for 5 minutes. This will help with a quicker recovery back to normal.  Let pain be your guide so you don't do too much.  This time is for you to recover.  You will be more comfortable if you sleep and rest with your head elevated either with a few pillows under you or in a recliner.  No stomach sleeping for a three months.  WORK Everyone returns to work at different times. As a rough guide, most people take at least 1 - 2 weeks off prior to returning to work. If  you need documentation for your job, give the forms to the front staff at the clinic.  DRIVING Arrange for someone to bring you home from the hospital after your surgery.  You may be able to drive a few days after surgery but not while taking any narcotics or valium.  BOWEL MOVEMENTS Constipation can occur after anesthesia and while taking pain medication.  It is important to stay ahead for your comfort.  We recommend taking Milk of Magnesia (2 tablespoons; twice a day) while taking the pain pills.  MEDICATIONS You may be prescribed should start after surgery At your preoperative visit for you history and physical you may have been given the following medications: An antibiotic: Start  this medication when you get home and take according to the instructions on the bottle. Zofran 4 mg:  This is to treat nausea and vomiting.  You can take this every 6 hours as needed and only if needed. Valium 2 mg for breast cancer patients: This is for muscle tightness if you have an implant or expander. This will help relax your muscle which also helps with pain control.  This can be taken every 12 hours as needed. Don't drive after taking this medication. Norco (hydrocodone/acetaminophen) 5/325 mg:  This is only to be used after you have taken the Motrin or the Tylenol. Every 8 hours as needed.   Over the counter Medication to take: Ibuprofen (Motrin) 600 mg:  Take this every 6 hours.  If you have additional pain then take 500 mg of the Tylenol every 8 hours.  Only take the Norco after you have tried these two. MiraLAX or Milk of Magnesia: Take this according to the bottle if you take the [place] Call your surgeon's office if any of the following occur: Fever 101 degrees F or greater Excessive bleeding or fluid from the incision site. Pain that increases over time without aid from the medications Redness, warmth, or pus draining from incision sites Persistent nausea or inability to take in liquids Severe misshapen area that underwent the operation.  Post Anesthesia Home Care Instructions  Activity: Get plenty of rest for the remainder of the day. A responsible individual must stay with you for 24 hours following the procedure.  For the next 24 hours, DO NOT: -Drive a car -Paediatric nurse -Drink alcoholic beverages -Take any medication unless instructed by your physician -Make any legal decisions or sign important papers.  Meals: Start with liquid foods such as gelatin or soup. Progress to regular foods as tolerated. Avoid greasy, spicy, heavy foods. If nausea and/or vomiting occur, drink only clear liquids until the nausea and/or vomiting subsides. Call your  physician if vomiting continues.  Special Instructions/Symptoms: Your throat may feel dry or sore from the anesthesia or the breathing tube placed in your throat during surgery. If this causes discomfort, gargle with warm salt water. The discomfort should disappear within 24 hours.  If you had a scopolamine patch placed behind your ear for the management of post- operative nausea and/or vomiting:  1. The medication in the patch is effective for 72 hours, after which it should be removed.  Wrap patch in a tissue and discard in the trash. Wash hands thoroughly with soap and water. 2. You may remove the patch earlier than 72 hours if you experience unpleasant side effects which may include dry mouth, dizziness or visual disturbances. 3. Avoid touching the patch. Wash your hands with soap and water after contact with the patch.

## 2023-05-16 NOTE — Anesthesia Procedure Notes (Signed)
Procedure Name: LMA Insertion Date/Time: 05/16/2023 12:10 PM  Performed by: Thornell Mule, CRNAPre-anesthesia Checklist: Patient identified, Emergency Drugs available, Suction available and Patient being monitored Patient Re-evaluated:Patient Re-evaluated prior to induction Oxygen Delivery Method: Circle system utilized Preoxygenation: Pre-oxygenation with 100% oxygen Induction Type: IV induction LMA: LMA inserted LMA Size: 4.0 Number of attempts: 1 Placement Confirmation: positive ETCO2 Tube secured with: Tape Dental Injury: Teeth and Oropharynx as per pre-operative assessment

## 2023-05-16 NOTE — Interval H&P Note (Signed)
History and Physical Interval Note:  05/16/2023 11:23 AM  Queen Blossom  has presented today for surgery, with the diagnosis of hx breast cancer.  The various methods of treatment have been discussed with the patient and family. After consideration of risks, benefits and other options for treatment, the patient has consented to  Procedure(s): Bilateral fat grafting to breasts with right breast liposuction and excision of excess breast tissue on the right (Bilateral) LIPOSUCTION WITH LIPOFILLING (Bilateral) as a surgical intervention.  The patient's history has been reviewed, patient examined, no change in status, stable for surgery.  I have reviewed the patient's chart and labs.  Questions were answered to the patient's satisfaction.     Alena Bills Michaelanthony Kempton

## 2023-05-16 NOTE — Anesthesia Postprocedure Evaluation (Signed)
Anesthesia Post Note  Patient: Kendrea Pacer  Procedure(s) Performed: Bilateral fat grafting to breasts with right breast liposuction and excision of excess breast tissue on the right (Right: Breast) LIPOSUCTION WITH LIPOFILLING (Bilateral: Breast)     Patient location during evaluation: PACU Anesthesia Type: General Level of consciousness: awake Pain management: pain level controlled Vital Signs Assessment: post-procedure vital signs reviewed and stable Respiratory status: spontaneous breathing, nonlabored ventilation and respiratory function stable Cardiovascular status: blood pressure returned to baseline and stable Postop Assessment: no apparent nausea or vomiting Anesthetic complications: no   No notable events documented.  Last Vitals:  Vitals:   05/16/23 1415 05/16/23 1430  BP: 135/86 139/77  Pulse: (!) 102 100  Resp: 13 17  Temp:    SpO2: 99% 97%    Last Pain:  Vitals:   05/16/23 1415  TempSrc:   PainSc: 3                  Linton Rump

## 2023-05-16 NOTE — Op Note (Signed)
DATE OF OPERATION: 05/16/2023  LOCATION: [facility]  PREOPERATIVE DIAGNOSIS: Breast asymmetry after breast reconstruction  POSTOPERATIVE DIAGNOSIS: Same  PROCEDURE:  Excision of right breast lateral tissue 1.5 x 15 cm skin and soft tissue Fat grafting to bilateral superior breasts Liposuction right lateral breast 300 cc  SURGEON: Foster Simpson, DO  ASSISTANT: Evelena Leyden, PA  EBL: 5 cc  CONDITION: Stable  COMPLICATIONS: None  INDICATION: The patient, Taylor Chandler, is a 47 y.o. female born on 1976-03-04, is here for treatment of breast asymmetry after reconstruction for breast cancer.   PROCEDURE DETAILS:  The patient was seen prior to surgery and marked.  The IV antibiotics were given. The patient was taken to the operating room and given a general anesthetic. A standard time out was performed and all information was confirmed by those in the room. SCDs were placed.   The patient was prepped and draped on the thorax and upper legs.  Local was placed in the abdominal incision laterally for 1 cm and the lateral breast bilaterally.  The #15 blade was used to make a small incision.  The tumescent was infused into the lateral right breast and the lateral abdominal area.  This was thought to have some available fat that was not damaged by the previous surgery.  The liposuction was then done on the lateral abdominal area.  We were able to collect nice looking adipose tissue.  This was collected in the Revolve device.  It was prepared according to the manufacture guidelines.  A small incision was placed in each breast laterally.  Through this incision I was able to inject the adipose into the superior medial aspect of both breasts for 50 cc in the right and 55 cc in the left.    All incisions were then closed with the 5-0 Monocryl.  Attention was then turned to the lateral right breast. Liposuction was done on the lateral breast to improve the symmetry (300 cc).  An area of  1.5 x 15 cm of skin and soft tissue was excised with the #15 blade and the bovie.  Hemostasis was achieved with the electrocautery.  The deep layer was closed with the 3-0 Monocryl followed by the 4-0 Monocryl to close the skin.  Derma bond was not used.  The right breast had steri strips applied.  The rest of the incisions were covered with a mepilex dressing. The patient was allowed to wake up and taken to recovery room in stable condition at the end of the case. The family was notified at the end of the case.   The advanced practice practitioner (APP) assisted throughout the case.  The APP was essential in retraction and counter traction when needed to make the case progress smoothly.  This retraction and assistance made it possible to see the tissue plans for the procedure.  The assistance was needed for blood control, tissue re-approximation and assisted with closure of the incision site.

## 2023-05-16 NOTE — Transfer of Care (Signed)
Immediate Anesthesia Transfer of Care Note  Patient: Taylor Chandler  Procedure(s) Performed: Bilateral fat grafting to breasts with right breast liposuction and excision of excess breast tissue on the right (Right: Breast) LIPOSUCTION WITH LIPOFILLING (Bilateral: Breast)  Patient Location: PACU  Anesthesia Type:General  Level of Consciousness: drowsy, patient cooperative, and responds to stimulation  Airway & Oxygen Therapy: Patient Spontanous Breathing and Patient connected to face mask oxygen  Post-op Assessment: Report given to RN and Post -op Vital signs reviewed and stable  Post vital signs: Reviewed and stable  Last Vitals:  Vitals Value Taken Time  BP 156/90 05/16/23 1348  Temp 36.2 C 05/16/23 1347  Pulse 112 05/16/23 1349  Resp 15 05/16/23 1349  SpO2 93 % 05/16/23 1349  Vitals shown include unfiled device data.  Last Pain:  Vitals:   05/16/23 1050  TempSrc: Oral  PainSc: 0-No pain      Patients Stated Pain Goal: 3 (05/16/23 1050)  Complications: No notable events documented.

## 2023-05-17 ENCOUNTER — Encounter (HOSPITAL_BASED_OUTPATIENT_CLINIC_OR_DEPARTMENT_OTHER): Payer: Self-pay | Admitting: Plastic Surgery

## 2023-05-18 ENCOUNTER — Encounter: Payer: Self-pay | Admitting: Plastic Surgery

## 2023-05-18 MED ORDER — HYDROCODONE-ACETAMINOPHEN 5-325 MG PO TABS: 1.0000 | ORAL_TABLET | Freq: Three times a day (TID) | ORAL | 0 refills | Status: AC | PRN

## 2023-05-25 ENCOUNTER — Encounter: Admitting: Plastic Surgery

## 2023-05-25 ENCOUNTER — Telehealth: Payer: Self-pay | Admitting: Plastic Surgery

## 2023-05-25 NOTE — Telephone Encounter (Signed)
Pt husband called and said his wife did not feel well enough to come in today and wanted to know if it was ok not to come in, this is the first apt after sx

## 2023-05-25 NOTE — Telephone Encounter (Signed)
Attempted to call patient, she did not answer. LVM to return call

## 2023-06-04 NOTE — Progress Notes (Unsigned)
Patient is a pleasant 47 year old female with PMH of breast cancer s/p bilateral reconstruction with Diep flap now s/p excision of lateral right breast tissue with fat grafting to bilateral breasts superiorly performed 05/16/2023 who presents to clinic for postoperative follow-up.  She tells me that she missed her initial postoperative visit due to discomfort in the left lower quadrant.  She states that she has had liposuction in the past as well as other surgeries and has not experienced similar postoperative pain.  She reports that she had what felt like a firm mass for the first couple of weeks, but it has softened considerably in the past week due to massage.  She denies any significant improvement in her discomfort.  She is accompanied by her husband at bedside.  She is tolerating p.o. intake and voiding bowels.  She denies any fevers.  She plans to go on a cruise with her husband and friends next week.  On exam, abdomen is soft, nondistended.  No obvious masses or areas of firmness appreciated on exam.  However, when standing you can appreciate slight asymmetric swelling in the LLQ compared to the RLQ.  No overlying skin changes.  She denies any considerable bruising postoperatively.  For what ever reason patient is experiencing considerable discomfort from the liposuction performed in left lower abdomen.  Based on her description of a firm mass, suspect that it was an area of fat necrosis or solidified hematoma that it has softened with mechanical massage.  Given the lack of any rigidity or peritoneal signs on exam as well as lack of any significant point tenderness, doubt any acute intra-abdominal process.  She also denies any systemic symptoms and reports that her discomfort is worse with certain activity.  Perhaps it was muscular injury.  Either way, I do not feel as though CT imaging is warranted at this time.  Recommend that she continue massage the area and wear compressive abdominal binder  spanks.  Remainder of her exam is entirely benign.  She is actually pleased with the outcome of her surgery and does report some improved symmetry between her breasts.  She is hopeful that she will not need additional surgery anytime in the near future.  Follow-up in 2 weeks on a day that Dr. Ulice Bold can also personally evaluate the patient.  Patient understands to call the office should she have any new or worsening symptoms in the interim.  Picture(s) obtained of the patient and placed in the chart were with the patient's or guardian's permission.

## 2023-06-05 ENCOUNTER — Ambulatory Visit (INDEPENDENT_AMBULATORY_CARE_PROVIDER_SITE_OTHER): Admitting: Physician Assistant

## 2023-06-05 DIAGNOSIS — Z9889 Other specified postprocedural states: Secondary | ICD-10-CM

## 2023-06-19 ENCOUNTER — Encounter: Admitting: Physician Assistant

## 2023-06-26 ENCOUNTER — Encounter: Admitting: Physician Assistant

## 2023-06-27 ENCOUNTER — Encounter: Admitting: Physician Assistant

## 2023-06-29 NOTE — Progress Notes (Deleted)
Patient is a pleasant 47 year old female with PMH of breast cancer s/p bilateral reconstruction with Diep flap now s/p excision of lateral right breast tissue with fat grafting to bilateral breasts superiorly performed 05/16/2023 who presents to clinic for postoperative follow-up.   She was last seen here in clinic on 06/05/2023.  At that time, complained of LLQ abdominal discomfort.  Abdominal exam was benign.  Slight asymmetric swelling in LLQ compared to RLQ, but no peritoneal signs or acute abdomen.  Recommended continued massage of the area and wear compressive garments.  She is pleased with the outcome of her surgery and reported some improvement with regard to symmetry of breasts.  Follow-up in 2 weeks, sooner if needed.  Today,

## 2023-07-02 ENCOUNTER — Encounter: Admitting: Physician Assistant

## 2023-07-16 ENCOUNTER — Telehealth: Payer: Self-pay | Admitting: Gastroenterology

## 2023-07-16 NOTE — Telephone Encounter (Signed)
Inbound call from patient's husband stating patient has been having severe abdominal pain. Stated patient vomited a few times yesterday and was blood in her vomit. Patient is scheduled for 12/26. Husband requesting a call to discuss further. Please advise, thank you.

## 2023-07-16 NOTE — Telephone Encounter (Signed)
I spoke with the pt husband and he states the pt began to have severe 10/10 abd pain and nausea. She then began to vomit blood since 10/27.  Her symptoms have not gotten any better.  She has been advised to go to the ED for evaluation.  The pt has been advised of the information and verbalized understanding.

## 2023-08-28 ENCOUNTER — Encounter (HOSPITAL_COMMUNITY): Payer: Self-pay

## 2023-08-28 ENCOUNTER — Emergency Department (HOSPITAL_COMMUNITY)

## 2023-08-28 ENCOUNTER — Emergency Department (HOSPITAL_COMMUNITY)
Admission: EM | Admit: 2023-08-28 | Discharge: 2023-08-29 | Disposition: A | Discharge status: Home / Self Care | Attending: Emergency Medicine | Admitting: Emergency Medicine

## 2023-08-28 ENCOUNTER — Other Ambulatory Visit: Payer: Self-pay

## 2023-08-28 DIAGNOSIS — Z853 Personal history of malignant neoplasm of breast: Secondary | ICD-10-CM | POA: Diagnosis not present

## 2023-08-28 DIAGNOSIS — R0602 Shortness of breath: Secondary | ICD-10-CM | POA: Insufficient documentation

## 2023-08-28 DIAGNOSIS — R202 Paresthesia of skin: Secondary | ICD-10-CM | POA: Insufficient documentation

## 2023-08-28 DIAGNOSIS — R079 Chest pain, unspecified: Secondary | ICD-10-CM | POA: Insufficient documentation

## 2023-08-28 DIAGNOSIS — R000 Tachycardia, unspecified: Secondary | ICD-10-CM | POA: Diagnosis not present

## 2023-08-28 LAB — CBC
HCT: 41.8 % (ref 36.0–46.0)
Hemoglobin: 14.3 g/dL (ref 12.0–15.0)
MCH: 29.4 pg (ref 26.0–34.0)
MCHC: 34.2 g/dL (ref 30.0–36.0)
MCV: 85.8 fL (ref 80.0–100.0)
Platelets: 356 10*3/uL (ref 150–400)
RBC: 4.87 MIL/uL (ref 3.87–5.11)
RDW: 13.8 % (ref 11.5–15.5)
WBC: 5.4 10*3/uL (ref 4.0–10.5)
nRBC: 0 % (ref 0.0–0.2)

## 2023-08-28 LAB — BASIC METABOLIC PANEL
Anion gap: 9 (ref 5–15)
BUN: 9 mg/dL (ref 6–20)
CO2: 22 mmol/L (ref 22–32)
Calcium: 9.4 mg/dL (ref 8.9–10.3)
Chloride: 103 mmol/L (ref 98–111)
Creatinine, Ser: 0.7 mg/dL (ref 0.44–1.00)
GFR, Estimated: >60 mL/min (ref >60)
Glucose, Bld: 99 mg/dL (ref 70–99)
Potassium: 4 mmol/L (ref 3.5–5.1)
Sodium: 134 mmol/L — ABNORMAL LOW (ref 135–145)

## 2023-08-28 LAB — TROPONIN I (HIGH SENSITIVITY): Troponin I (High Sensitivity): 3 ng/L (ref <18)

## 2023-08-28 LAB — D-DIMER, QUANTITATIVE: D-Dimer, Quant: 0.75 ug{FEU}/mL — ABNORMAL HIGH (ref 0.00–0.50)

## 2023-08-28 MED ORDER — IOHEXOL 350 MG/ML SOLN
80.0000 mL | Freq: Once | INTRAVENOUS | Status: AC | PRN
  Administered 2023-08-28: 75 mL via INTRAVENOUS

## 2023-08-28 MED ORDER — SODIUM CHLORIDE (PF) 0.9 % IJ SOLN
INTRAMUSCULAR | Status: AC
  Filled 2023-08-28: qty 50

## 2023-08-28 NOTE — ED Provider Triage Note (Signed)
Emergency Medicine Provider Triage Evaluation Note  Loubertha Beld , a 47 y.o. female  was evaluated in triage.  Pt complains of Chest pain x 1 day.  Review of Systems  Positive: Shortness of breath, tachycardia, weakness Negative: N/V, cough,  Physical Exam  Ht 5\' 6"  (1.676 m)   Wt 72.4 kg   BMI 25.76 kg/m  Gen:   Awake, anxious Resp:  Mildly dysnpeic MSK:   Moves extremities without difficulty  Other:  No LE swelling  Medical Decision Making  Medically screening exam initiated at 9:49 PM.  Appropriate orders placed.  Queen Blossom was informed that the remainder of the evaluation will be completed by another provider, this initial triage assessment does not replace that evaluation, and the importance of remaining in the ED until their evaluation is complete.  D-dimer   Dolphus Jenny, PA-C 08/28/23 2151

## 2023-08-28 NOTE — ED Triage Notes (Signed)
Pt reports with chest pain x 2 days and left arm numbness that started tonight. Pt reports not feeling well recently and has also had headaches. Hx of TIA.

## 2023-08-28 NOTE — ED Provider Notes (Signed)
[place] [facility]MENT AT [facility] Provider Note   CSN: [id number] Arrival date & time: 08/28/23  2134     History  Chief Complaint  Patient presents with   Chest Pain   Numbness    Taylor Chandler is a 47 y.o. female past medical history significant for lupus, breast cancer, TIA presents today for chest pain x 1 day and left arm numbness that began tonight.  Patient reports shortness of breath and weakness.  Patient denies nausea, vomiting, cough, sore throat, fevers, or abdominal pain.   Chest Pain Associated symptoms: numbness and shortness of breath        Home Medications Prior to Admission medications   Medication Sig Start Date End Date Taking? Authorizing Provider  ALPRAZolam Prudy Feeler) 1 MG tablet Take 1 mg by mouth daily. 02/07/22   [provider]  buPROPion (WELLBUTRIN XL) 300 MG 24 hr tablet Take 300 mg by mouth daily. 11/30/21   [provider]  busPIRone (BUSPAR) 7.5 MG tablet Take 7.5 mg by mouth 2 (two) times daily.    [provider]  Cyanocobalamin (VITAMIN B-12 PO) Take by mouth.    [provider]  doxepin (SINEQUAN) 10 MG capsule Take 10 mg by mouth at bedtime as needed. 05/03/23   [provider]  hydroxychloroquine (PLAQUENIL) 200 MG tablet Take 2 tabs po nightly except Sundays 07/10/22   Dorothyann Peng, MD  letrozole [facility]) 2.5 MG tablet Take 2.5 mg by mouth daily. 11/30/21   [provider]  levothyroxine (SYNTHROID) 50 MCG tablet Take 50 mcg by mouth daily before breakfast. 01/18/23   [provider]  MAGNESIUM PO Take 1 tablet by mouth daily in the afternoon.    [provider]  ondansetron (ZOFRAN) 4 MG tablet Take 1 tablet (4 mg total) by mouth every 8 (eight) hours as needed for nausea or vomiting. 05/15/23   Scheeler, Kermit Balo, PA-C      Allergies    Compazine [prochlorperazine] and Wound dressing adhesive    Review of Systems   Review of Systems   Respiratory:  Positive for shortness of breath.   Cardiovascular:  Positive for chest pain.  Neurological:  Positive for numbness.    Physical Exam Updated Vital Signs BP 133/83   Pulse 98   Temp 98.4 F (36.9 C) (Oral)   Resp 14   Ht 5\' 6"  (1.676 m)   Wt 72.4 kg   SpO2 98%   BMI 25.76 kg/m  Physical Exam Vitals and nursing note reviewed.  Constitutional:      General: She is not in acute distress.    Appearance: She is well-developed.  HENT:     Head: Normocephalic and atraumatic.  Eyes:     Conjunctiva/sclera: Conjunctivae normal.  Cardiovascular:     Rate and Rhythm: Regular rhythm. Tachycardia present.     Heart sounds: Normal heart sounds. No murmur heard. Pulmonary:     Effort: Pulmonary effort is normal. No respiratory distress.     Breath sounds: Normal breath sounds.     Comments: Mildly tachypneic Abdominal:     Palpations: Abdomen is soft.     Tenderness: There is no abdominal tenderness.  Musculoskeletal:        General: No swelling.     Cervical back: Normal range of motion and neck supple.     Right lower leg: No tenderness. No edema.     Left lower leg: No tenderness. No edema.  Skin:    General:  Skin is warm and dry.     Capillary Refill: Capillary refill takes less than 2 seconds.  Neurological:     General: No focal deficit present.     Mental Status: She is alert.     Motor: No weakness.  Psychiatric:        Mood and Affect: Mood is anxious.     ED Results / Procedures / Treatments   Labs (all labs ordered are listed, but only abnormal results are displayed) Labs Reviewed  BASIC METABOLIC PANEL - Abnormal; Notable for the following components:      Result Value   Sodium 134 (*)    All other components within normal limits  D-DIMER, QUANTITATIVE - Abnormal; Notable for the following components:   D-Dimer, Quant 0.75 (*)    All other components within normal limits  CBC  TROPONIN I (HIGH SENSITIVITY)  TROPONIN I (HIGH SENSITIVITY)     EKG EKG Interpretation Date/Time:  Tuesday August 28 2023 22:08:02 EST Ventricular Rate:  115 PR Interval:  172 QRS Duration:  98 QT Interval:  328 QTC Calculation: 454 R Axis:   28  Text Interpretation: Sinus tachycardia Probable LVH with secondary repol abnrm Similar to prior tracing Confirmed by Alona Bene [phone]) on 08/28/2023 10:11:51 PM  Radiology CT Angio Chest PE W and/or Wo Contrast  Result Date: 08/28/2023 CLINICAL DATA:  Pulmonary embolism (PE) suspected, low to intermediate prob, positive D-dimer. Chest pain. EXAM: CT ANGIOGRAPHY CHEST WITH CONTRAST TECHNIQUE: Multidetector CT imaging of the chest was performed using the standard protocol during bolus administration of intravenous contrast. Multiplanar CT image reconstructions and MIPs were obtained to evaluate the vascular anatomy. RADIATION DOSE REDUCTION: This exam was performed according to the departmental dose-optimization program which includes automated exposure control, adjustment of the mA and/or kV according to patient size and/or use of iterative reconstruction technique. CONTRAST:  75mL OMNIPAQUE IOHEXOL 350 MG/ML SOLN COMPARISON:  02/26/2023 FINDINGS: Cardiovascular: No filling defects in the pulmonary arteries to suggest pulmonary emboli. Heart is normal size. Aorta is normal caliber. Mediastinum/Nodes: No mediastinal, hilar, or axillary adenopathy. Trachea and esophagus are unremarkable. Thyroid unremarkable. Lungs/Pleura: Lungs are clear. No focal airspace opacities or suspicious nodules. No effusions. Upper Abdomen: No acute findings Musculoskeletal: Prior bilateral mastectomies. Chest wall soft tissues are unremarkable. No acute bony abnormality. Chronic L1 compression fracture and posterior fusion changes in the lower thoracic and visualized upper lumbar spine. Review of the MIP images confirms the above findings. IMPRESSION: No evidence of pulmonary embolus. No acute cardiopulmonary disease. Electronically  Signed   By: Charlett Nose M.D.   On: 08/28/2023 23:49   DG Chest 2 View  Result Date: 08/28/2023 CLINICAL DATA:  Chest pain EXAM: CHEST - 2 VIEW COMPARISON:  02/26/2023 FINDINGS: The heart size and mediastinal contours are within normal limits. Both lungs are clear. The visualized skeletal structures are unremarkable. Posterior spinal rods noted in the lower thoracic and visualized lumbar spine. IMPRESSION: No acute cardiopulmonary disease. Electronically Signed   By: Charlett Nose M.D.   On: 08/28/2023 21:51    Procedures Procedures    Medications Ordered in ED Medications  iohexol (OMNIPAQUE) 350 MG/ML injection 80 mL (75 mLs Intravenous Contrast Given 08/28/23 2336)    ED Course/ Medical Decision Making/ A&P                                 Medical Decision Making Amount and/or Complexity of Data  Reviewed Labs: ordered. Radiology: ordered.   This patient presents to the ED with chief complaint(s) of chest pain with pertinent past medical history of TIA, breast cancer, lupus which further complicates the presenting complaint. The complaint involves an extensive differential diagnosis and also carries with it a high risk of complications and morbidity.    The differential diagnosis includes STEMI, NSTEMI, PE, anxiety  Additional history obtained: Additional history obtained from spouse  ED Course and Reassessment:   Independent labs interpretation:  The following labs were independently interpreted:  CBC: No notable findings BMP: Mild hyponatremia Troponin: 3 D-dimer: 0.75 EKG: Sinus tachycardia similar to prior tracings  Independent visualization of imaging: - I independently visualized the following imaging with scope of interpretation limited to determining acute life threatening conditions related to emergency care: Chest x-ray, which revealed no acute cardiopulmonary disease.  CTA which showed no evidence of PE.  Dispo: Considered for admission or further workup  however patient's vital signs, physical exam, labs, and imaging have all been reassuring.  Patient's symptoms likely due to musculoskeletal pain or anxiety.        Final Clinical Impression(s) / ED Diagnoses Final diagnoses:  Chest pain, unspecified type    Rx / DC Orders ED Discharge Orders     None         Dolphus Jenny, PA-C 08/28/23 2354    Maia Plan, MD 09/05/23 2257

## 2023-08-28 NOTE — Discharge Instructions (Signed)
Today you were seen for chest pain.  Please follow-up with your PCP if your symptoms persist for further workup and treatment.  Thank you for letting us treat you today. After reviewing your labs and imaging, I feel you are safe to go home. Please follow up with your PCP in the next several days and provide them with your records from this visit. Return to the Emergency Room if pain becomes severe or symptoms worsen.

## 2023-08-29 LAB — TROPONIN I (HIGH SENSITIVITY): Troponin I (High Sensitivity): 2 ng/L (ref <18)

## 2023-09-13 ENCOUNTER — Ambulatory Visit: Admitting: Gastroenterology

## 2023-12-04 ENCOUNTER — Ambulatory Visit: Admitting: Gastroenterology

## 2024-08-06 ENCOUNTER — Ambulatory Visit: Admission: RE | Admit: 2024-08-06 | Discharge: 2024-08-06 | Discharge status: Against Medical Advice

## 2024-08-06 NOTE — ED Notes (Signed)
 Informed by front desk that patient left facility.
